# Patient Record
Sex: Female | Born: 1995 | Race: White | Hispanic: No | Marital: Married | Smoking: Never smoker
Health system: Southern US, Community
[De-identification: ages and names within clinical notes are randomized; demographics above are authoritative.]

## PROBLEM LIST (undated history)

## (undated) DIAGNOSIS — E282 Polycystic ovarian syndrome: Secondary | ICD-10-CM

## (undated) HISTORY — DX: Polycystic ovarian syndrome: E28.2

## (undated) HISTORY — PX: CHOLECYSTECTOMY: SHX55

---
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
## 2018-11-23 ENCOUNTER — Other Ambulatory Visit: Payer: Self-pay

## 2018-11-23 DIAGNOSIS — Z20822 Contact with and (suspected) exposure to covid-19: Secondary | ICD-10-CM

## 2018-11-24 LAB — NOVEL CORONAVIRUS, NAA: SARS-CoV-2, NAA: DETECTED — AB

## 2020-01-06 ENCOUNTER — Ambulatory Visit: Payer: Self-pay | Attending: Internal Medicine

## 2020-01-06 DIAGNOSIS — Z23 Encounter for immunization: Secondary | ICD-10-CM

## 2020-01-06 NOTE — Progress Notes (Signed)
   Covid-19 Vaccination Clinic  Name:  Katherine Whitaker    MRN: [id number] DOB: Jun 02, 1995  01/06/2020  Ms. Katherine Whitaker was observed post Covid-19 immunization for 15 minutes without incident. She was provided with Vaccine Information Sheet and instruction to access the V-Safe system.   Ms. Katherine Whitaker was instructed to call 911 with any severe reactions post vaccine: Marland Kitchen Difficulty breathing  . Swelling of face and throat  . A fast heartbeat  . A bad rash all over body  . Dizziness and weakness   Immunizations Administered    No immunizations on file.

## 2021-06-27 ENCOUNTER — Encounter: Payer: Self-pay | Admitting: Radiology

## 2021-07-05 ENCOUNTER — Encounter: Payer: Self-pay | Admitting: Physician Assistant

## 2021-07-05 ENCOUNTER — Ambulatory Visit: Payer: BLUE CROSS/BLUE SHIELD | Admitting: Physician Assistant

## 2021-07-05 VITALS — BP 112/74 | HR 64 | Temp 98.3°F | Ht 64.0 in | Wt 269.0 lb

## 2021-07-05 DIAGNOSIS — E538 Deficiency of other specified B group vitamins: Secondary | ICD-10-CM | POA: Diagnosis not present

## 2021-07-05 DIAGNOSIS — Z13228 Encounter for screening for other metabolic disorders: Secondary | ICD-10-CM

## 2021-07-05 DIAGNOSIS — Z7689 Persons encountering health services in other specified circumstances: Secondary | ICD-10-CM | POA: Diagnosis not present

## 2021-07-05 DIAGNOSIS — Z1321 Encounter for screening for nutritional disorder: Secondary | ICD-10-CM

## 2021-07-05 DIAGNOSIS — Z130 Encounter for screening for diseases of the blood and blood-forming organs and certain disorders involving the immune mechanism: Secondary | ICD-10-CM

## 2021-07-05 DIAGNOSIS — Z1329 Encounter for screening for other suspected endocrine disorder: Secondary | ICD-10-CM

## 2021-07-05 DIAGNOSIS — Z6841 Body Mass Index (BMI) 40.0 and over, adult: Secondary | ICD-10-CM

## 2021-07-05 DIAGNOSIS — R5383 Other fatigue: Secondary | ICD-10-CM | POA: Diagnosis not present

## 2021-07-05 NOTE — Progress Notes (Signed)
New Patient Office Visit  Subjective    Patient ID: Katherine Whitaker, female    DOB: [birth year]  Age: 26 y.o. MRN: [id number]  CC: No chief complaint on file.   HPI Katherine Whitaker presents to establish care. Patient reports remote hx of low vitamin B12 and previously was on B12 injections. States has been struggling to lose weight. Has worked on diet changes and exercise but does seem to be successful. Since 2020 has gained about 70 pounds. States has also noticed acne and coarse facial hair. Feels tired. Periods were regular with OCP which she has been off x about a yr and since then they have been irregular. Patient stays active with playing golf, coaching soccer and gardening. Family history is pertinent for diabetes (maternal grandmother), early onset dementia (maternal grandmother), PCOS (cousin), high cholesterol (paternal grandparents), heart attack (paternal grandfather). Patient has never been a smoker.      Outpatient Encounter Medications as of 07/05/2021  Medication Sig   norethindrone-ethinyl estradiol (JUNEL FE,GILDESS FE,LOESTRIN FE) 1-20 MG-MCG tablet Take 1 tablet by mouth daily. (Patient not taking: Reported on 07/05/2021)   No facility-administered encounter medications on file as of 07/05/2021.    History reviewed. No pertinent past medical history.  Past Surgical History:  Procedure Laterality Date   KNEE SURGERY  2011    Family History  Problem Relation Age of Onset   Melanoma Paternal Grandmother     Social History   Socioeconomic History   Marital status: Married    Spouse name: Not on file   Number of children: Not on file   Years of education: Not on file   Highest education level: Not on file  Occupational History   Not on file  Tobacco Use   Smoking status: Never   Smokeless tobacco: Never  Substance and Sexual Activity   Alcohol use: Yes    Alcohol/week: 0.0 standard drinks    Comment: Less than 1 week   Drug use: No   Sexual activity: Not  Currently    Partners: Male    Birth control/protection: Pill  Other Topics Concern   Not on file  Social History Narrative   Not on file   Social Determinants of Health   Financial Resource Strain: Not on file  Food Insecurity: Not on file  Transportation Needs: Not on file  Physical Activity: Not on file  Stress: Not on file  Social Connections: Not on file  Intimate Partner Violence: Not on file    ROS Review of Systems:  A fourteen system review of systems was performed and found to be positive as per HPI.   Objective    BP 112/74 (BP Location: Left Arm, Patient Position: Sitting, Cuff Size: Large)   Pulse 64   Temp 98.3 F (36.8 C) (Oral)   Ht 5' 4" (1.626 m)   Wt 269 lb (122 kg)   LMP 05/16/2021 (Exact Date)   BMI 46.17 kg/m   Physical Exam General:  Pleasant and cooperative, appropriate for stated age.  Neuro:  Alert and oriented,  extra-ocular muscles intact  HEENT:  Normocephalic, atraumatic, neck supple  Skin:  no gross rash, warm, pink. Cardiac:  RRR, S1 S2 Respiratory: CTA B/L  Vascular:  Ext warm, no cyanosis apprec.; cap RF less 2 sec. Psych:  No HI/SI, judgement and insight good, Euthymic mood. Full Affect.      Assessment & Plan:   Problem List Items Addressed This Visit   None Visit Diagnoses  Class 3 severe obesity with body mass index (BMI) of 40.0 to 44.9 in adult, unspecified obesity type, unspecified whether serious comorbidity present (HCC)    -  Primary   Relevant Orders   Comp Met (CMET)   Lipid Profile   HgB A1c   Fatigue, unspecified type       Relevant Orders   CBC w/Diff   Comp Met (CMET)   HgB A1c   Vitamin D (25 hydroxy)   TSH   B12 deficiency       Relevant Orders   B12 and Folate Panel   Encounter to establish care       Screening for endocrine, nutritional, metabolic and immunity disorder       Relevant Orders   CBC w/Diff   Comp Met (CMET)   Lipid Profile   HgB A1c   Vitamin D (25 hydroxy)   B12 and  Folate Panel       Class 3 severe obesity with body mass index (BMI) of 40.0 to 44.9 in adult, unspecified obesity type, unspecified whether serious comorbidity present: -Encourage to continue weight loss efforts with diet and lifestyle interventions. Will evaluate for cardiovascular risk factors with annual physical labs in 2 weeks. Also discussed with patient possibly underlying PCOS. Will further discuss pending lab results. Patient will also be establishing with OB/GYN, has upcoming visit.  Fatigue: -Will place future lab orders to evaluate for nutritional deficiency, endocrine or metabolic etiology.  B12 deficiency: -Will collect B12 and folate with lab visit. -Recommend to continue oral B12 supplement.   Return in about 2 weeks (around 07/19/2021) for CPE and FBW few days prior.   Lorrene Reid, PA-C

## 2021-07-05 NOTE — Patient Instructions (Signed)
Calorie Counting for Weight Loss Calories are units of energy. Your body needs a certain number of calories from food to keep going throughout the day. When you eat or drink more calories than your body needs, your body stores the extra calories mostly as fat. When you eat or drink fewer calories than your body needs, your body burns fat to get the energy it needs. Calorie counting means keeping track of how many calories you eat and drink each day. Calorie counting can be helpful if you need to lose weight. If you eat fewer calories than your body needs, you should lose weight. Ask your health care provider what a healthy weight is for you. For calorie counting to work, you will need to eat the right number of calories each day to lose a healthy amount of weight per week. A dietitian can help you figure out how many calories you need in a day and will suggest ways to reach your calorie goal. A healthy amount of weight to lose each week is usually 1-2 lb (0.5-0.9 kg). This usually means that your daily calorie intake should be reduced by 500-750 calories. Eating 1,200-1,500 calories a day can help most women lose weight. Eating 1,500-1,800 calories a day can help most men lose weight. What do I need to know about calorie counting? Work with your health care provider or dietitian to determine how many calories you should get each day. To meet your daily calorie goal, you will need to: Find out how many calories are in each food that you would like to eat. Try to do this before you eat. Decide how much of the food you plan to eat. Keep a food log. Do this by writing down what you ate and how many calories it had. To successfully lose weight, it is important to balance calorie counting with a healthy lifestyle that includes regular activity. Where do I find calorie information?  The number of calories in a food can be found on a Nutrition Facts label. If a food does not have a Nutrition Facts label, try  to look up the calories online or ask your dietitian for help. Remember that calories are listed per serving. If you choose to have more than one serving of a food, you will have to multiply the calories per serving by the number of servings you plan to eat. For example, the label on a package of bread might say that a serving size is 1 slice and that there are 90 calories in a serving. If you eat 1 slice, you will have eaten 90 calories. If you eat 2 slices, you will have eaten 180 calories. How do I keep a food log? After each time that you eat, record the following in your food log as soon as possible: What you ate. Be sure to include toppings, sauces, and other extras on the food. How much you ate. This can be measured in cups, ounces, or number of items. How many calories were in each food and drink. The total number of calories in the food you ate. Keep your food log near you, such as in a pocket-sized notebook or on an app or website on your mobile phone. Some programs will calculate calories for you and show you how many calories you have left to meet your daily goal. What are some portion-control tips? Know how many calories are in a serving. This will help you know how many servings you can have of a certain   food. Use a measuring cup to measure serving sizes. You could also try weighing out portions on a kitchen scale. With time, you will be able to estimate serving sizes for some foods. Take time to put servings of different foods on your favorite plates or in your favorite bowls and cups so you know what a serving looks like. Try not to eat straight from a food's packaging, such as from a bag or box. Eating straight from the package makes it hard to see how much you are eating and can lead to overeating. Put the amount you would like to eat in a cup or on a plate to make sure you are eating the right portion. Use smaller plates, glasses, and bowls for smaller portions and to prevent  overeating. Try not to multitask. For example, avoid watching TV or using your computer while eating. If it is time to eat, sit down at a table and enjoy your food. This will help you recognize when you are full. It will also help you be more mindful of what and how much you are eating. What are tips for following this plan? Reading food labels Check the calorie count compared with the serving size. The serving size may be smaller than what you are used to eating. Check the source of the calories. Try to choose foods that are high in protein, fiber, and vitamins, and low in saturated fat, trans fat, and sodium. Shopping Read nutrition labels while you shop. This will help you make healthy decisions about which foods to buy. Pay attention to nutrition labels for low-fat or fat-free foods. These foods sometimes have the same number of calories or more calories than the full-fat versions. They also often have added sugar, starch, or salt to make up for flavor that was removed with the fat. Make a grocery list of lower-calorie foods and stick to it. Cooking Try to cook your favorite foods in a healthier way. For example, try baking instead of frying. Use low-fat dairy products. Meal planning Use more fruits and vegetables. One-half of your plate should be fruits and vegetables. Include lean proteins, such as chicken, turkey, and fish. Lifestyle Each week, aim to do one of the following: 150 minutes of moderate exercise, such as walking. 75 minutes of vigorous exercise, such as running. General information Know how many calories are in the foods you eat most often. This will help you calculate calorie counts faster. Find a way of tracking calories that works for you. Get creative. Try different apps or programs if writing down calories does not work for you. What foods should I eat?  Eat nutritious foods. It is better to have a nutritious, high-calorie food, such as an avocado, than a food with  few nutrients, such as a bag of potato chips. Use your calories on foods and drinks that will fill you up and will not leave you hungry soon after eating. Examples of foods that fill you up are nuts and nut butters, vegetables, lean proteins, and high-fiber foods such as whole grains. High-fiber foods are foods with more than 5 g of fiber per serving. Pay attention to calories in drinks. Low-calorie drinks include water and unsweetened drinks. The items listed above may not be a complete list of foods and beverages you can eat. Contact a dietitian for more information. What foods should I limit? Limit foods or drinks that are not good sources of vitamins, minerals, or protein or that are high in unhealthy fats. These   include: Candy. Other sweets. Sodas, specialty coffee drinks, alcohol, and juice. The items listed above may not be a complete list of foods and beverages you should avoid. Contact a dietitian for more information. How do I count calories when eating out? Pay attention to portions. Often, portions are much larger when eating out. Try these tips to keep portions smaller: Consider sharing a meal instead of getting your own. If you get your own meal, eat only half of it. Before you start eating, ask for a container and put half of your meal into it. When available, consider ordering smaller portions from the menu instead of full portions. Pay attention to your food and drink choices. Knowing the way food is cooked and what is included with the meal can help you eat fewer calories. If calories are listed on the menu, choose the lower-calorie options. Choose dishes that include vegetables, fruits, whole grains, low-fat dairy products, and lean proteins. Choose items that are boiled, broiled, grilled, or steamed. Avoid items that are buttered, battered, fried, or served with cream sauce. Items labeled as crispy are usually fried, unless stated otherwise. Choose water, low-fat milk,  unsweetened iced tea, or other drinks without added sugar. If you want an alcoholic beverage, choose a lower-calorie option, such as a glass of wine or light beer. Ask for dressings, sauces, and syrups on the side. These are usually high in calories, so you should limit the amount you eat. If you want a salad, choose a garden salad and ask for grilled meats. Avoid extra toppings such as bacon, cheese, or fried items. Ask for the dressing on the side, or ask for olive oil and vinegar or lemon to use as dressing. Estimate how many servings of a food you are given. Knowing serving sizes will help you be aware of how much food you are eating at restaurants. Where to find more information Centers for Disease Control and Prevention: www.cdc.gov U.S. Department of Agriculture: myplate.gov Summary Calorie counting means keeping track of how many calories you eat and drink each day. If you eat fewer calories than your body needs, you should lose weight. A healthy amount of weight to lose per week is usually 1-2 lb (0.5-0.9 kg). This usually means reducing your daily calorie intake by 500-750 calories. The number of calories in a food can be found on a Nutrition Facts label. If a food does not have a Nutrition Facts label, try to look up the calories online or ask your dietitian for help. Use smaller plates, glasses, and bowls for smaller portions and to prevent overeating. Use your calories on foods and drinks that will fill you up and not leave you hungry shortly after a meal. This information is not intended to replace advice given to you by your health care provider. Make sure you discuss any questions you have with your health care provider. Document Revised: 03/03/2019 Document Reviewed: 03/03/2019 Elsevier Patient Education  2023 Elsevier Inc.  

## 2021-07-10 ENCOUNTER — Encounter: Payer: Self-pay | Admitting: Radiology

## 2021-07-10 ENCOUNTER — Other Ambulatory Visit (HOSPITAL_COMMUNITY)
Admission: RE | Admit: 2021-07-10 | Discharge: 2021-07-10 | Disposition: A | Discharge status: Home / Self Care | Payer: BLUE CROSS/BLUE SHIELD | Source: Ambulatory Visit | Attending: Radiology | Admitting: Radiology

## 2021-07-10 ENCOUNTER — Ambulatory Visit (INDEPENDENT_AMBULATORY_CARE_PROVIDER_SITE_OTHER): Payer: BLUE CROSS/BLUE SHIELD | Admitting: Radiology

## 2021-07-10 VITALS — BP 118/80 | Ht 63.75 in | Wt 269.0 lb

## 2021-07-10 DIAGNOSIS — Z01419 Encounter for gynecological examination (general) (routine) without abnormal findings: Secondary | ICD-10-CM | POA: Insufficient documentation

## 2021-07-10 DIAGNOSIS — N912 Amenorrhea, unspecified: Secondary | ICD-10-CM | POA: Diagnosis not present

## 2021-07-10 DIAGNOSIS — N970 Female infertility associated with anovulation: Secondary | ICD-10-CM

## 2021-07-10 DIAGNOSIS — E282 Polycystic ovarian syndrome: Secondary | ICD-10-CM

## 2021-07-10 DIAGNOSIS — L700 Acne vulgaris: Secondary | ICD-10-CM

## 2021-07-10 LAB — PREGNANCY, URINE: Preg Test, Ur: NEGATIVE

## 2021-07-10 MED ORDER — ADAPALENE 0.1 % EX GEL: Freq: Every day | CUTANEOUS | 0 refills | Status: DC

## 2021-07-10 NOTE — Progress Notes (Signed)
Katherine Whitaker 13-May-1995 [id number]   History:  26 y.o. G0 with known hx of PCOS presents for annual exam. She was doing well on OCPs, stopped 11 months ago in hopes of becoming pregnant. She has been monitoring ovulation for the past 3 months and has not ovulated. LMP 05/16/21. She is also concerned about her acne, worse on her chin along with hair growth. PCP is checking hgba1c next week and per pt they are considering starting a GLP-1 medication.   Gynecologic History Patient's last menstrual period was 05/16/2021 (exact date). Period Cycle (Days):  (28-56) Period Duration (Days): 5 Period Pattern: (!) Irregular Menstrual Flow: Moderate Dysmenorrhea: (!) Moderate Dysmenorrhea Symptoms: Cramping Contraception/Family planning: none Sexually active: yes Last Pap: 2019. Results were: normal   Obstetric History OB History  Gravida Para Term Preterm AB Living  0 0 0 0 0 0  SAB IAB Ectopic Multiple Live Births  0 0 0 0       The following portions of the patient's history were reviewed and updated as appropriate: allergies, current medications, past family history, past medical history, past social history, past surgical history, and problem list.  Review of Systems Pertinent items noted in HPI and remainder of comprehensive ROS otherwise negative.   Past medical history, past surgical history, family history and social history were all reviewed and documented in the EPIC chart.   Exam:  Vitals:   07/10/21 1345  BP: 118/80  Weight: 269 lb (122 kg)  Height: 5' 3.75" (1.619 m)   Body mass index is 46.54 kg/m.  General appearance:  Normal Thyroid:  Symmetrical, normal in size, without palpable masses or nodularity. Respiratory  Auscultation:  Clear without wheezing or rhonchi Cardiovascular  Auscultation:  Regular rate, without rubs, murmurs or gallops  Edema/varicosities:  Not grossly evident Abdominal  Soft,nontender, without masses, guarding or  rebound.  Liver/spleen:  No organomegaly noted  Hernia:  None appreciated  Skin  Inspection:  Grossly normal Breasts: Examined lying and sitting.   Right: Without masses, retractions, nipple discharge or axillary adenopathy.   Left: Without masses, retractions, nipple discharge or axillary adenopathy. Genitourinary   Inguinal/mons:  Normal without inguinal adenopathy  External genitalia:  Normal appearing vulva with no masses, tenderness, or lesions  BUS/Urethra/Skene's glands:  Normal without masses or exudate  Vagina:  Normal appearing with normal color and discharge, no lesions  Cervix:  Normal appearing without discharge or lesions  Uterus:  Normal in size, shape and contour.  Mobile, nontender  Adnexa/parametria:     Rt: Normal in size, without masses or tenderness.   Lt: Normal in size, without masses or tenderness.  Anus and perineum: Normal   Patient informed chaperone available to be present for breast and pelvic exam. Patient has requested no chaperone to be present. Patient has been advised what will be completed during breast and pelvic exam.   Assessment/Plan:   1. Well woman exam with routine gynecological exam  - Cytology - PAP( [facility])  2. PCOS (polycystic ovarian syndrome)  3. 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 Acne vulgaris Salicylic acid exfoliant 3x/week Micellar water to remove makeup Double cleanse skin We will avoid Spironolactone while she is trying to conceive - adapalene (DIFFERIN) 0.1 % gel; Apply topically at bedtime.  Dispense:  45 g; Refill: 0   Discussed SBE, pap screening as directed/appropriate. Recommend of exercise weekly, including weight bearing exercise. Encouraged the use of seatbelts and  sunscreen. Return in 1 year for annual or as needed.   Ladene Allocca B WHNP-BC 2:26 PM 07/10/2021

## 2021-07-12 LAB — CYTOLOGY - PAP: Diagnosis: NEGATIVE

## 2021-07-15 ENCOUNTER — Other Ambulatory Visit: Payer: BLUE CROSS/BLUE SHIELD

## 2021-07-15 ENCOUNTER — Telehealth: Payer: Self-pay

## 2021-07-15 DIAGNOSIS — Z13228 Encounter for screening for other metabolic disorders: Secondary | ICD-10-CM | POA: Diagnosis not present

## 2021-07-15 DIAGNOSIS — Z1329 Encounter for screening for other suspected endocrine disorder: Secondary | ICD-10-CM | POA: Diagnosis not present

## 2021-07-15 DIAGNOSIS — Z1321 Encounter for screening for nutritional disorder: Secondary | ICD-10-CM | POA: Diagnosis not present

## 2021-07-15 DIAGNOSIS — Z130 Encounter for screening for diseases of the blood and blood-forming organs and certain disorders involving the immune mechanism: Secondary | ICD-10-CM

## 2021-07-15 DIAGNOSIS — E538 Deficiency of other specified B group vitamins: Secondary | ICD-10-CM | POA: Diagnosis not present

## 2021-07-15 DIAGNOSIS — R5383 Other fatigue: Secondary | ICD-10-CM

## 2021-07-15 NOTE — Telephone Encounter (Signed)
[facility] [phone]) called to ask if they can make a change in Rx.  Jami, NP  prescribed Adapalene 0.1% Gel but they are unable to obtain that. They said they are able to fill Rx with Adapalene 0.1% cream. Will that be okay?  She said patient came in on Saturday to pick up Rx and she let her know she would have to check with provider before filling.

## 2021-07-16 LAB — CBC WITH DIFFERENTIAL/PLATELET
Basophils Absolute: 0 10*3/uL (ref 0.0–0.2)
Basos: 0 %
EOS (ABSOLUTE): 0.1 10*3/uL (ref 0.0–0.4)
Eos: 2 %
Hematocrit: 41.8 % (ref 34.0–46.6)
Hemoglobin: 13.6 g/dL (ref 11.1–15.9)
Immature Grans (Abs): 0 10*3/uL (ref 0.0–0.1)
Immature Granulocytes: 0 %
Lymphocytes Absolute: 2.5 10*3/uL (ref 0.7–3.1)
Lymphs: 28 %
MCH: 26.6 pg (ref 26.6–33.0)
MCHC: 32.5 g/dL (ref 31.5–35.7)
MCV: 82 fL (ref 79–97)
Monocytes Absolute: 0.5 10*3/uL (ref 0.1–0.9)
Monocytes: 5 %
Neutrophils Absolute: 5.9 10*3/uL (ref 1.4–7.0)
Neutrophils: 65 %
Platelets: 320 10*3/uL (ref 150–450)
RBC: 5.11 x10E6/uL (ref 3.77–5.28)
RDW: 13.7 % (ref 11.7–15.4)
WBC: 9.1 10*3/uL (ref 3.4–10.8)

## 2021-07-16 LAB — COMPREHENSIVE METABOLIC PANEL
ALT: 33 IU/L — ABNORMAL HIGH (ref 0–32)
AST: 20 IU/L (ref 0–40)
Albumin/Globulin Ratio: 1.9 (ref 1.2–2.2)
Albumin: 4.3 g/dL (ref 3.9–5.0)
Alkaline Phosphatase: 99 IU/L (ref 44–121)
BUN/Creatinine Ratio: 10 (ref 9–23)
BUN: 9 mg/dL (ref 6–20)
Bilirubin Total: 0.2 mg/dL (ref 0.0–1.2)
CO2: 22 mmol/L (ref 20–29)
Calcium: 9.5 mg/dL (ref 8.7–10.2)
Chloride: 103 mmol/L (ref 96–106)
Creatinine, Ser: 0.86 mg/dL (ref 0.57–1.00)
Globulin, Total: 2.3 g/dL (ref 1.5–4.5)
Glucose: 85 mg/dL (ref 70–99)
Potassium: 4.7 mmol/L (ref 3.5–5.2)
Sodium: 141 mmol/L (ref 134–144)
Total Protein: 6.6 g/dL (ref 6.0–8.5)
eGFR: 95 mL/min/{1.73_m2} (ref >59)

## 2021-07-16 LAB — LIPID PANEL
Chol/HDL Ratio: 4.3 ratio (ref 0.0–4.4)
Cholesterol, Total: 175 mg/dL (ref 100–199)
HDL: 41 mg/dL (ref >39)
LDL Chol Calc (NIH): 117 mg/dL — ABNORMAL HIGH (ref 0–99)
Triglycerides: 90 mg/dL (ref 0–149)
VLDL Cholesterol Cal: 17 mg/dL (ref 5–40)

## 2021-07-16 LAB — HEMOGLOBIN A1C
Est. average glucose Bld gHb Est-mCnc: 108 mg/dL
Hgb A1c MFr Bld: 5.4 % (ref 4.8–5.6)

## 2021-07-16 LAB — B12 AND FOLATE PANEL
Folate: 2.8 ng/mL — ABNORMAL LOW (ref >3.0)
Vitamin B-12: 1869 pg/mL — ABNORMAL HIGH (ref 232–1245)

## 2021-07-16 LAB — VITAMIN D 25 HYDROXY (VIT D DEFICIENCY, FRACTURES): Vit D, 25-Hydroxy: 41.6 ng/mL (ref 30.0–100.0)

## 2021-07-16 LAB — TSH: TSH: 1.48 u[IU]/mL (ref 0.450–4.500)

## 2021-07-16 NOTE — Telephone Encounter (Signed)
That's fine to substitute.

## 2021-07-16 NOTE — Telephone Encounter (Signed)
I called the pharmacy prior to their opening and left detailed message in voice mail that okay to fill Rx with the cream.

## 2021-07-19 NOTE — Patient Instructions (Signed)

## 2021-07-22 ENCOUNTER — Ambulatory Visit (INDEPENDENT_AMBULATORY_CARE_PROVIDER_SITE_OTHER): Payer: BLUE CROSS/BLUE SHIELD | Admitting: Physician Assistant

## 2021-07-22 ENCOUNTER — Ambulatory Visit
Admission: RE | Admit: 2021-07-22 | Discharge: 2021-07-22 | Disposition: A | Discharge status: Home / Self Care | Payer: BLUE CROSS/BLUE SHIELD | Source: Ambulatory Visit | Attending: Physician Assistant | Admitting: Physician Assistant

## 2021-07-22 ENCOUNTER — Telehealth: Payer: Self-pay

## 2021-07-22 ENCOUNTER — Encounter: Payer: Self-pay | Admitting: Physician Assistant

## 2021-07-22 VITALS — BP 105/74 | HR 81 | Ht 63.0 in | Wt 270.2 lb

## 2021-07-22 DIAGNOSIS — M79671 Pain in right foot: Secondary | ICD-10-CM

## 2021-07-22 DIAGNOSIS — Z124 Encounter for screening for malignant neoplasm of cervix: Secondary | ICD-10-CM | POA: Diagnosis not present

## 2021-07-22 DIAGNOSIS — R748 Abnormal levels of other serum enzymes: Secondary | ICD-10-CM

## 2021-07-22 DIAGNOSIS — M7989 Other specified soft tissue disorders: Secondary | ICD-10-CM | POA: Diagnosis not present

## 2021-07-22 DIAGNOSIS — Z0000 Encounter for general adult medical examination without abnormal findings: Secondary | ICD-10-CM | POA: Diagnosis not present

## 2021-07-22 DIAGNOSIS — E7800 Pure hypercholesterolemia, unspecified: Secondary | ICD-10-CM | POA: Diagnosis not present

## 2021-07-22 DIAGNOSIS — S99921A Unspecified injury of right foot, initial encounter: Secondary | ICD-10-CM | POA: Diagnosis not present

## 2021-07-22 DIAGNOSIS — E538 Deficiency of other specified B group vitamins: Secondary | ICD-10-CM

## 2021-07-22 NOTE — Progress Notes (Signed)
Complete physical exam   Patient: Katherine Whitaker   DOB: [birth year]   25 y.o. Female  MRN: [id number] Visit Date: 07/22/2021   Chief Complaint  Patient presents with   Annual Exam    Patient presents today for physical. She would like to discuss labs. She has been to OB/GYN since her last visit.    Subjective    Katherine Whitaker is a 26 y.o. female who presents today for a complete physical exam.  She reports consuming a low fat diet.  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  She generally feels fairly well. She does have additional problems to discuss today. Patient reports Saturday was playing softball and was running to first base, came to an awkward stop and felt pain over right midfoot, states gets sharp pain when she puts full pressure in the right foot. No swelling or bruising. Denies popping sensation or twisting her ankle.     History reviewed. No pertinent past medical history. Past Surgical History:  Procedure Laterality Date   KNEE SURGERY  2011   Social History   Socioeconomic History   Marital status: Married    Spouse name: Not on file   Number of children: Not on file   Years of education: Not on file   Highest education level: Not on file  Occupational History   Not on file  Tobacco Use   Smoking status: Never   Smokeless tobacco: Never  Substance and Sexual Activity   Alcohol use: Yes    Alcohol/week: 0.0 standard drinks of alcohol    Comment: Less than 1 week   Drug use: No   Sexual activity: Yes    Partners: Male    Birth control/protection: None  Other Topics Concern   Not on file  Social History Narrative   Not on file   Social Determinants of Health   Financial Resource Strain: Not on file  Food Insecurity: Not on file  Transportation Needs: Not on file  Physical Activity: Not on file  Stress: Not on file  Social Connections: Not on file  Intimate Partner Violence: Not on file      Medications: Outpatient Medications Prior to Visit  Medication Sig   adapalene (DIFFERIN) 0.1 % gel Apply topically at bedtime.   Prenatal Vit-Fe Fumarate-FA (PRENATAL PO) Take by mouth.   VITAMIN D PO Take by mouth.   Cyanocobalamin (B-12 PO) Take by mouth. (Patient not taking: Reported on 07/22/2021)   No facility-administered medications prior to visit.    Review of Systems Review of Systems:  A fourteen system review of systems was performed and found to be positive as per HPI.  Last CBC Lab Results  Component Value Date   WBC 9.1 07/15/2021   HGB 13.6 07/15/2021   HCT 41.8 07/15/2021   MCV 82 07/15/2021   MCH 26.6 07/15/2021   RDW 13.7 07/15/2021   PLT 320 37/16/9678   Last metabolic panel Lab Results  Component Value Date   GLUCOSE 85 07/15/2021   NA 141 07/15/2021   K 4.7 07/15/2021   CL 103 07/15/2021   CO2 22 07/15/2021   BUN 9 07/15/2021   CREATININE 0.86 07/15/2021   EGFR 95 07/15/2021   CALCIUM 9.5 07/15/2021   PROT 6.6 07/15/2021   ALBUMIN 4.3 07/15/2021   LABGLOB 2.3 07/15/2021   AGRATIO 1.9 07/15/2021   BILITOT 0.2 07/15/2021   ALKPHOS 99 07/15/2021   AST 20 07/15/2021   ALT 33 (H)  07/15/2021   Last lipids Lab Results  Component Value Date   CHOL 175 07/15/2021   HDL 41 07/15/2021   LDLCALC 117 (H) 07/15/2021   TRIG 90 07/15/2021   CHOLHDL 4.3 07/15/2021   Last hemoglobin A1c Lab Results  Component Value Date   HGBA1C 5.4 07/15/2021   Last thyroid functions Lab Results  Component Value Date   TSH 1.480 07/15/2021   Last vitamin D Lab Results  Component Value Date   VD25OH 41.6 07/15/2021   Last vitamin B12 and Folate Lab Results  Component Value Date   VITAMINB12 1,869 (H) 07/15/2021   FOLATE 2.8 (L) 07/15/2021    Objective     BP 105/74   Pulse 81   Ht 5' 3"  (1.6 m)   Wt 270 lb 3.2 oz (122.6 kg)   LMP 05/16/2021 (Exact Date)   SpO2 96%   BMI 47.86 kg/m  BP Readings from Last 3 Encounters:  07/22/21 105/74   07/10/21 118/80  07/05/21 112/74   Wt Readings from Last 3 Encounters:  07/22/21 270 lb 3.2 oz (122.6 kg)  07/10/21 269 lb (122 kg)  07/05/21 269 lb (122 kg)    Physical Exam   General Appearance:    Alert, cooperative, in no acute distress, appears stated age   Head:    Normocephalic, without obvious abnormality, atraumatic  Eyes:    PERRL, conjunctiva/corneas clear, EOM's intact, fundi    benign, both eyes  Ears:    Normal TM's and external ear canals, both ears  Nose:   Nares normal, septum midline, mucosa normal, no drainage    or sinus tenderness  Throat:   Lips, mucosa, and tongue normal; teeth and gums normal  Neck:   Supple, symmetrical, trachea midline, no adenopathy;    thyroid:  no enlargement/tenderness/nodules; no JVD  Back:     Symmetric, no curvature, ROM normal, no CVA tenderness  Lungs:     Clear to auscultation bilaterally, respirations unlabored  Chest Wall:    No tenderness or deformity   Heart:    Normal heart rate. Normal rhythm. No murmurs, rubs, or gallops.   Breast Exam:    deferred  Abdomen:     Soft, non-tender, bowel sounds active all four quadrants,    no masses, no organomegaly  Pelvic:    deferred  Extremities:   All extremities are intact. No cyanosis or edema. Tenderness of right foot especially at the base of the heel.  Pulses:   2+ and symmetric all extremities  Skin:   Skin color, texture, turgor normal, no rashes or lesions  Lymph nodes:   Cervical and supraclavicular nodes normal  Neurologic:   CNII-XII grossly intact.     Last depression screening scores    07/22/2021   11:00 AM 07/05/2021   10:30 AM  PHQ 2/9 Scores  PHQ - 2 Score 0 1  PHQ- 9 Score 0 6  Exception Documentation Medical reason    Last fall risk screening    07/22/2021   11:00 AM  Fall Risk   Falls in the past year? 1  Number falls in past yr: 1  Injury with Fall? 1  Risk for fall due to : Orthopedic patient  Follow up Education provided;Falls evaluation  completed     No results found for any visits on 07/22/21.  Assessment & Plan    Routine Health Maintenance and Physical Exam  Exercise Activities and Dietary recommendations -Discussed heart healthy diet low in fat and carbohydrates.  Immunization History  Administered Date(s) Administered   Moderna SARS-COV2 Booster Vaccination 01/06/2020   PFIZER(Purple Top)SARS-COV-2 Vaccination 04/26/2019, 05/24/2019    Health Maintenance  Topic Date Due   HPV VACCINES (1 - 2-dose series) Never done   HIV Screening  Never done   Hepatitis C Screening  Never done   TETANUS/TDAP  Never done   COVID-19 Vaccine (3 - Pfizer series) 03/02/2020   INFLUENZA VACCINE  09/03/2021   PAP-Cervical Cytology Screening  07/10/2024   PAP SMEAR-Modifier  07/10/2024    Discussed health benefits of physical activity, and encouraged her to engage in regular exercise appropriate for her age and condition.  Problem List Items Addressed This Visit   None Visit Diagnoses     Healthcare maintenance    -  Primary   Screening for cervical cancer       Right foot pain       Relevant Orders   DG Foot Complete Right   Elevated LDL cholesterol level       Elevated liver enzymes       Low folate          Discussed with patient most recent lab results which are essentially within normal limits with the exception of lipid panel, mild elevation of ALT and mildly low folate. Discussed with patient to start taking folic acid 1 mg and reduce B12 supplement dose. Encourage to continue weight loss efforts with diet and lifestyle interventions. Continue low fat diet. Recommend repeating fasting labs (lipid panel, cmp, b12/folate) in 6 months. Patient reports has obtined HPV and Tdap vaccines. Will verify with [facility]. UTD pap (followed by OB/GYN). Will place x-ray order for right foot to evaluate for fracture. Recommend to continue with non-weight bearing until x-ray results.    Return in about 6 months (around  01/21/2022) for HLD, Wt and FBW (lipid panel, cmp, b12/folate).       Lorrene Reid, PA-C  [facility] [phone] (phone) [phone] (fax)  [place]

## 2021-07-22 NOTE — Telephone Encounter (Signed)
Spoke with patient explained to her that her Gardasil  was not showing up in the [place] data base, she explained to me that Dr Maurice Small did this for her and she would sign a release and get [place] a copy for our records.

## 2021-07-25 DIAGNOSIS — M79671 Pain in right foot: Secondary | ICD-10-CM | POA: Insufficient documentation

## 2021-07-26 ENCOUNTER — Other Ambulatory Visit: Payer: BLUE CROSS/BLUE SHIELD

## 2021-07-26 DIAGNOSIS — M722 Plantar fascial fibromatosis: Secondary | ICD-10-CM | POA: Insufficient documentation

## 2021-07-26 DIAGNOSIS — M6289 Other specified disorders of muscle: Secondary | ICD-10-CM | POA: Insufficient documentation

## 2021-07-26 DIAGNOSIS — L700 Acne vulgaris: Secondary | ICD-10-CM | POA: Diagnosis not present

## 2021-07-26 DIAGNOSIS — N970 Female infertility associated with anovulation: Secondary | ICD-10-CM

## 2021-07-26 DIAGNOSIS — M76821 Posterior tibial tendinitis, right leg: Secondary | ICD-10-CM | POA: Diagnosis not present

## 2021-07-26 DIAGNOSIS — M779 Enthesopathy, unspecified: Secondary | ICD-10-CM | POA: Insufficient documentation

## 2021-07-26 DIAGNOSIS — R29898 Other symptoms and signs involving the musculoskeletal system: Secondary | ICD-10-CM | POA: Diagnosis not present

## 2021-07-26 DIAGNOSIS — N912 Amenorrhea, unspecified: Secondary | ICD-10-CM | POA: Diagnosis not present

## 2021-07-26 DIAGNOSIS — E782 Mixed hyperlipidemia: Secondary | ICD-10-CM | POA: Diagnosis not present

## 2021-07-27 LAB — FOLLICLE STIMULATING HORMONE: FSH: 6.8 m[IU]/mL

## 2021-07-27 LAB — PROLACTIN: Prolactin: 7.6 ng/mL

## 2021-07-29 LAB — ANTI-MULLERIAN HORMONE (AMH), FEMALE: Anti-Mullerian Hormones(AMH), Female: 2.84 ng/mL (ref 0.69–13.39)

## 2021-07-30 ENCOUNTER — Other Ambulatory Visit: Payer: Self-pay

## 2021-07-30 DIAGNOSIS — N979 Female infertility, unspecified: Secondary | ICD-10-CM

## 2021-07-30 LAB — TESTOSTERONE, FREE & TOTAL

## 2021-07-30 LAB — ESTRADIOL: Estradiol: 38 pg/mL

## 2021-07-30 LAB — DHEA-SULFATE: DHEA-SO4: 400 ug/dL — ABNORMAL HIGH (ref 14–349)

## 2021-08-13 ENCOUNTER — Other Ambulatory Visit: Payer: BLUE CROSS/BLUE SHIELD

## 2021-08-13 DIAGNOSIS — N979 Female infertility, unspecified: Secondary | ICD-10-CM | POA: Diagnosis not present

## 2021-08-14 ENCOUNTER — Telehealth: Payer: Self-pay

## 2021-08-14 LAB — PROGESTERONE: Progesterone: 0.8 ng/mL

## 2021-08-14 NOTE — Telephone Encounter (Signed)
Patient scheduled on 10/14/21

## 2021-08-14 NOTE — Telephone Encounter (Signed)
Referral faxed to [facility]. 

## 2021-08-14 NOTE — Progress Notes (Signed)
Progesterone low, likely anovulatory cycles. Refer to [facility].

## 2021-08-14 NOTE — Telephone Encounter (Signed)
Tanda Rockers, NP  [facility] Triage Progesterone low, likely anovulatory cycles. Refer to [facility].

## 2021-08-23 DIAGNOSIS — M76821 Posterior tibial tendinitis, right leg: Secondary | ICD-10-CM | POA: Diagnosis not present

## 2021-08-23 DIAGNOSIS — R29898 Other symptoms and signs involving the musculoskeletal system: Secondary | ICD-10-CM | POA: Diagnosis not present

## 2021-08-23 DIAGNOSIS — M722 Plantar fascial fibromatosis: Secondary | ICD-10-CM | POA: Diagnosis not present

## 2021-10-14 DIAGNOSIS — E559 Vitamin D deficiency, unspecified: Secondary | ICD-10-CM | POA: Diagnosis not present

## 2021-10-14 DIAGNOSIS — E282 Polycystic ovarian syndrome: Secondary | ICD-10-CM | POA: Diagnosis not present

## 2021-10-14 DIAGNOSIS — Z3169 Encounter for other general counseling and advice on procreation: Secondary | ICD-10-CM | POA: Diagnosis not present

## 2021-10-14 DIAGNOSIS — E288 Other ovarian dysfunction: Secondary | ICD-10-CM | POA: Diagnosis not present

## 2021-10-14 DIAGNOSIS — Z319 Encounter for procreative management, unspecified: Secondary | ICD-10-CM | POA: Diagnosis not present

## 2021-11-05 DIAGNOSIS — E282 Polycystic ovarian syndrome: Secondary | ICD-10-CM | POA: Diagnosis not present

## 2021-11-05 DIAGNOSIS — Z3169 Encounter for other general counseling and advice on procreation: Secondary | ICD-10-CM | POA: Diagnosis not present

## 2021-12-09 DIAGNOSIS — Z3200 Encounter for pregnancy test, result unknown: Secondary | ICD-10-CM | POA: Diagnosis not present

## 2021-12-09 DIAGNOSIS — Z3141 Encounter for fertility testing: Secondary | ICD-10-CM | POA: Diagnosis not present

## 2022-01-01 ENCOUNTER — Other Ambulatory Visit: Payer: Self-pay

## 2022-01-01 DIAGNOSIS — E538 Deficiency of other specified B group vitamins: Secondary | ICD-10-CM

## 2022-01-01 DIAGNOSIS — R748 Abnormal levels of other serum enzymes: Secondary | ICD-10-CM

## 2022-01-01 DIAGNOSIS — E7800 Pure hypercholesterolemia, unspecified: Secondary | ICD-10-CM

## 2022-01-01 DIAGNOSIS — R5383 Other fatigue: Secondary | ICD-10-CM

## 2022-01-01 DIAGNOSIS — Z0000 Encounter for general adult medical examination without abnormal findings: Secondary | ICD-10-CM

## 2022-01-09 ENCOUNTER — Ambulatory Visit (INDEPENDENT_AMBULATORY_CARE_PROVIDER_SITE_OTHER): Payer: BLUE CROSS/BLUE SHIELD | Admitting: Radiology

## 2022-01-09 VITALS — BP 132/82

## 2022-01-09 DIAGNOSIS — Z23 Encounter for immunization: Secondary | ICD-10-CM

## 2022-01-09 DIAGNOSIS — N912 Amenorrhea, unspecified: Secondary | ICD-10-CM

## 2022-01-09 DIAGNOSIS — Z3201 Encounter for pregnancy test, result positive: Secondary | ICD-10-CM

## 2022-01-09 LAB — PREGNANCY, URINE: Preg Test, Ur: POSITIVE — AB

## 2022-01-09 NOTE — Progress Notes (Signed)
   Katherine Whitaker 07-01-1995 [id number]   History:  26 y.o. G0 presents for +UPT at home 3 days ago. Has been trying for pregnancy for the past 17 months.  Gynecologic History Patient's last menstrual period was 12/01/2021 (exact date).   Obstetric History OB History  Gravida Para Term Preterm AB Living  0 0 0 0 0 0  SAB IAB Ectopic Multiple Live Births  0 0 0 0       The following portions of the patient's history were reviewed and updated as appropriate: allergies, current medications, past family history, past medical history, past social history, past surgical history, and problem list.  Review of Systems Pertinent items noted in HPI and remainder of comprehensive ROS otherwise negative.   Past medical history, past surgical history, family history and social history were all reviewed and documented in the EPIC chart.   Exam:  Vitals:   01/09/22 1426  BP: 132/82   There is no height or weight on file to calculate BMI.  General appearance:  Normal, obese Genitourinary   Inguinal/mons:  Normal without inguinal adenopathy  External genitalia:  Normal appearing vulva with no masses, tenderness, or lesions  BUS/Urethra/Skene's glands:  Normal without masses or exudate  Vagina:  Normal appearing with normal color and discharge, no lesions  Cervix:  Normal appearing without discharge or lesions  Uterus:  Normal in size, shape and contour.  Mobile, nontender  Adnexa/parametria:     Rt: Normal in size, without masses or tenderness.   Lt: Normal in size, without masses or tenderness.  Anus and perineum: Normal   Patient informed chaperone available to be present for breast and pelvic exam. Patient has requested no chaperone to be present. Patient has been advised what will be completed during breast and pelvic exam.   Assessment/Plan:   1. Amenorrhea - Pregnancy, urine +  2. Need for immunization against influenza - Flu Vaccine QUAD 80mo+IM (Fluarix, Fluzone &  Alfiuria Quad PF)  3. Positive pregnancy test EDD based on LMP 09/07/22 - B-HCG Quant  Schedule NOB visit with OB provider  Continue PNV  Idalis Hoelting B WHNP-BC 2:59 PM 01/09/2022

## 2022-01-10 ENCOUNTER — Other Ambulatory Visit: Payer: BLUE CROSS/BLUE SHIELD

## 2022-01-10 ENCOUNTER — Other Ambulatory Visit: Payer: Self-pay | Admitting: *Deleted

## 2022-01-10 DIAGNOSIS — Z3201 Encounter for pregnancy test, result positive: Secondary | ICD-10-CM

## 2022-01-10 LAB — HCG, QUANTITATIVE, PREGNANCY: HCG, Total, QN: 1216 m[IU]/mL

## 2022-01-13 ENCOUNTER — Other Ambulatory Visit: Payer: BLUE CROSS/BLUE SHIELD

## 2022-01-13 DIAGNOSIS — Z3201 Encounter for pregnancy test, result positive: Secondary | ICD-10-CM | POA: Diagnosis not present

## 2022-01-14 LAB — HCG, QUANTITATIVE, PREGNANCY: HCG, Total, QN: 5351 m[IU]/mL

## 2022-01-20 ENCOUNTER — Ambulatory Visit: Payer: BLUE CROSS/BLUE SHIELD

## 2022-01-21 ENCOUNTER — Ambulatory Visit: Payer: BLUE CROSS/BLUE SHIELD | Admitting: Physician Assistant

## 2022-01-23 ENCOUNTER — Ambulatory Visit (INDEPENDENT_AMBULATORY_CARE_PROVIDER_SITE_OTHER): Payer: BLUE CROSS/BLUE SHIELD

## 2022-01-23 VITALS — Wt 270.0 lb

## 2022-01-23 DIAGNOSIS — Z3480 Encounter for supervision of other normal pregnancy, unspecified trimester: Secondary | ICD-10-CM | POA: Insufficient documentation

## 2022-01-23 DIAGNOSIS — Z369 Encounter for antenatal screening, unspecified: Secondary | ICD-10-CM

## 2022-01-23 HISTORY — DX: Encounter for supervision of other normal pregnancy, unspecified trimester: Z34.80

## 2022-01-23 NOTE — Progress Notes (Signed)
New OB Intake  I connected with  Katherine Whitaker on 01/23/22 at  1:15 PM EST by telephone and verified that I am speaking with the correct person using two identifiers. Nurse is located at [employer] and pt is located at home.  I explained I am completing New OB Intake today. We discussed her EDD of 09/07/2022 that is based on LMP of 12/01/2021. Pt is G1/P0. I reviewed her allergies, medications, Medical/Surgical/OB history, and appropriate screenings. Based on history, this is a/an pregnancy uncomplicated .   Patient Active Problem List   Diagnosis Date Noted   Supervision of other normal pregnancy, antepartum 01/23/2022    Concerns addressed today Asked if she should/could have her Beta HCG drawn again; adv there is really no need to as her numbers were beautiful.  Delivery Plans:  Plans to deliver at [facility] - pt not sure; adv if she goes elsewhere she will have someone deliver her that she hasn't met before.  Anatomy US Explained first scheduled [place] will be scheduled soon and an anatomy scan will be done at 20 weeks.  Labs Discussed genetic screening with patient. Patient desires genetic testing to be drawn with new OB labs.  Discussed possible labs to be drawn at new OB appointment.  COVID Vaccine Patient has had COVID vaccine.   Social Determinants of Health Food Insecurity: denies food insecurity Transportation: Patient denies transportation needs.  First visit review I reviewed new OB appt with pt. I explained she will have ob bloodwork and pap smear/pelvic exam if indicated. Explained pt will be seen by Dr. Rubie Maid at first visit; encounter routed to appropriate provider.   Katherine Whitaker, [place] 01/23/2022  1:39 PM

## 2022-01-23 NOTE — Patient Instructions (Signed)
First Trimester of Pregnancy  The first trimester of pregnancy starts on the first day of your last menstrual period until the end of week 12. This is also called months 1 through 3 of pregnancy. Body changes during your first trimester Your body goes through many changes during pregnancy. The changes usually return to normal after your baby is born. Physical changes You may gain or lose weight. Your breasts may grow larger and hurt. The area around your nipples may get darker. Dark spots or blotches may develop on your face. You may have changes in your hair. Health changes You may feel like you might vomit (nauseous), and you may vomit. You may have heartburn. You may have headaches. You may have trouble pooping (constipation). Your gums may bleed. Other changes You may get tired easily. You may pee (urinate) more often. Your menstrual periods will stop. You may not feel hungry. You may want to eat certain kinds of food. You may have changes in your emotions from day to day. You may have more dreams. Follow these instructions at home: Medicines Take over-the-counter and prescription medicines only as told by your doctor. Some medicines are not safe during pregnancy. Take a prenatal vitamin that contains at least 600 micrograms (mcg) of folic acid. Eating and drinking Eat healthy meals that include: Fresh fruits and vegetables. Whole grains. Good sources of protein, such as meat, eggs, or tofu. Low-fat dairy products. Avoid raw meat and unpasteurized juice, milk, and cheese. If you feel like you may vomit, or you vomit: Eat 4 or 5 small meals a day instead of 3 large meals. Try eating a few soda crackers. Drink liquids between meals instead of during meals. You may need to take these actions to prevent or treat trouble pooping: Drink enough fluids to keep your pee (urine) pale yellow. Eat foods that are high in fiber. These include beans, whole grains, and fresh fruits and  vegetables. Limit foods that are high in fat and sugar. These include fried or sweet foods. Activity Exercise only as told by your doctor. Most people can do their usual exercise routine during pregnancy. Stop exercising if you have cramps or pain in your lower belly (abdomen) or low back. Do not exercise if it is too hot or too humid, or if you are in a place of great height (high altitude). Avoid heavy lifting. If you choose to, you may have sex unless your doctor tells you not to. Relieving pain and discomfort Wear a good support bra if your breasts are sore. Rest with your legs raised (elevated) if you have leg cramps or low back pain. If you have bulging veins (varicose veins) in your legs: Wear support hose as told by your doctor. Raise your feet for 15 minutes, 3-4 times a day. Limit salt in your food. Safety Wear your seat belt at all times when you are in a car. Talk with your doctor if someone is hurting you or yelling at you. Talk with your doctor if you are feeling sad or have thoughts of hurting yourself. Lifestyle Do not use hot tubs, steam rooms, or saunas. Do not douche. Do not use tampons or scented sanitary pads. Do not use herbal medicines, illegal drugs, or medicines that are not approved by your doctor. Do not drink alcohol. Do not smoke or use any products that contain nicotine or tobacco. If you need help quitting, ask your doctor. Avoid cat litter boxes and soil that is used by cats. These carry  germs that can cause harm to the baby and can cause a loss of your baby by miscarriage or stillbirth. General instructions Keep all follow-up visits. This is important. Ask for help if you need counseling or if you need help with nutrition. Your doctor can give you advice or tell you where to go for help. Visit your dentist. At home, brush your teeth with a soft toothbrush. Floss gently. Write down your questions. Take them to your prenatal visits. Where to find more  information American Pregnancy Association: americanpregnancy.org American College of Obstetricians and Gynecologists: www.acog.org Office on Women's Health: womenshealth.gov/pregnancy Contact a doctor if: You are dizzy. You have a fever. You have mild cramps or pressure in your lower belly. You have a nagging pain in your belly area. You continue to feel like you may vomit, you vomit, or you have watery poop (diarrhea) for 24 hours or longer. You have a bad-smelling fluid coming from your vagina. You have pain when you pee. You are exposed to a disease that spreads from person to person, such as chickenpox, measles, Zika virus, HIV, or hepatitis. Get help right away if: You have spotting or bleeding from your vagina. You have very bad belly cramping or pain. You have shortness of breath or chest pain. You have any kind of injury, such as from a fall or a car crash. You have new or increased pain, swelling, or redness in an arm or leg. Summary The first trimester of pregnancy starts on the first day of your last menstrual period until the end of week 12 (months 1 through 3). Eat 4 or 5 small meals a day instead of 3 large meals. Do not smoke or use any products that contain nicotine or tobacco. If you need help quitting, ask your doctor. Keep all follow-up visits. This information is not intended to replace advice given to you by your health care provider. Make sure you discuss any questions you have with your health care provider. Document Revised: 06/29/2019 Document Reviewed: 05/05/2019 Elsevier Patient Education  2023 Elsevier Inc. Commonly Asked Questions During Pregnancy  Cats: A parasite can be excreted in cat feces.  To avoid exposure you need to have another person empty the little box.  If you must empty the litter box you will need to wear gloves.  Wash your hands after handling your cat.  This parasite can also be found in raw or undercooked meat so this should also be  avoided.  Colds, Sore Throats, Flu: Please check your medication sheet to see what you can take for symptoms.  If your symptoms are unrelieved by these medications please call the office.  Dental Work: Most any dental work your dentist recommends is permitted.  X-rays should only be taken during the first trimester if absolutely necessary.  Your abdomen should be shielded with a lead apron during all x-rays.  Please notify your provider prior to receiving any x-rays.  Novocaine is fine; gas is not recommended.  If your dentist requires a note from us prior to dental work please call the office and we will provide one for you.  Exercise: Exercise is an important part of staying healthy during your pregnancy.  You may continue most exercises you were accustomed to prior to pregnancy.  Later in your pregnancy you will most likely notice you have difficulty with activities requiring balance like riding a bicycle.  It is important that you listen to your body and avoid activities that put you at a higher   risk of falling.  Adequate rest and staying well hydrated are a must!  If you have questions about the safety of specific activities ask your provider.    Exposure to Children with illness: Try to avoid obvious exposure; report any symptoms to [place] when noted,  If you have chicken pos, red measles or mumps, you should be immune to these diseases.   Please do not take any vaccines while pregnant unless you have checked with your OB provider.  Fetal Movement: After 28 weeks we recommend you do "kick counts" twice daily.  Lie or sit down in a calm quiet environment and count your baby movements "kicks".  You should feel your baby at least 10 times per hour.  If you have not felt 10 kicks within the first hour get up, walk around and have something sweet to eat or drink then repeat for an additional hour.  If count remains less than 10 per hour notify your provider.  Fumigating: Follow your pest control agent's  advice as to how long to stay out of your home.  Ventilate the area well before re-entering.  Hemorrhoids:   Most over-the-counter preparations can be used during pregnancy.  Check your medication to see what is safe to use.  It is important to use a stool softener or fiber in your diet and to drink lots of liquids.  If hemorrhoids seem to be getting worse please call the office.   Hot Tubs:  Hot tubs Jacuzzis and saunas are not recommended while pregnant.  These increase your internal body temperature and should be avoided.  Intercourse:  Sexual intercourse is safe during pregnancy as long as you are comfortable, unless otherwise advised by your provider.  Spotting may occur after intercourse; report any bright red bleeding that is heavier than spotting.  Labor:  If you know that you are in labor, please go to the hospital.  If you are unsure, please call the office and let us help you decide what to do.  Lifting, straining, etc:  If your job requires heavy lifting or straining please check with your provider for any limitations.  Generally, you should not lift items heavier than that you can lift simply with your hands and arms (no back muscles)  Painting:  Paint fumes do not harm your pregnancy, but may make you ill and should be avoided if possible.  Latex or water based paints have less odor than oils.  Use adequate ventilation while painting.  Permanents & Hair Color:  Chemicals in hair dyes are not recommended as they cause increase hair dryness which can increase hair loss during pregnancy.  " Highlighting" and permanents are allowed.  Dye may be absorbed differently and permanents may not hold as well during pregnancy.  Sunbathing:  Use a sunscreen, as skin burns easily during pregnancy.  Drink plenty of fluids; avoid over heating.  Tanning Beds:  Because their possible side effects are still unknown, tanning beds are not recommended.  Ultrasound Scans:  Routine ultrasounds are performed  at approximately 20 weeks.  You will be able to see your baby's general anatomy an if you would like to know the gender this can usually be determined as well.  If it is questionable when you conceived you may also receive an ultrasound early in your pregnancy for dating purposes.  Otherwise ultrasound exams are not routinely performed unless there is a medical necessity.  Although you can request a scan we ask that you pay for it when  conducted because insurance does not cover " patient request" scans.  Work: If your pregnancy proceeds without complications you may work until your due date, unless your physician or employer advises otherwise.  Round Ligament Pain/Pelvic Discomfort:  Sharp, shooting pains not associated with bleeding are fairly common, usually occurring in the second trimester of pregnancy.  They tend to be worse when standing up or when you remain standing for long periods of time.  These are the result of pressure of certain pelvic ligaments called "round ligaments".  Rest, Tylenol and heat seem to be the most effective relief.  As the womb and fetus grow, they rise out of the pelvis and the discomfort improves.  Please notify the office if your pain seems different than that described.  It may represent a more serious condition.  Common Medications Safe in Pregnancy  Acne:      Constipation:  Benzoyl Peroxide     Colace  Clindamycin      Dulcolax Suppository  Topica Erythromycin     Fibercon  Salicylic Acid      Metamucil         Miralax AVOID:        Senakot   Accutane    Cough:  Retin-A       Cough Drops  Tetracycline      Phenergan w/ Codeine if Rx  Minocycline      Robitussin (Plain & DM)  Antibiotics:     Crabs/Lice:  Ceclor       RID  Cephalosporins    AVOID:  E-Mycins      Kwell  Keflex  Macrobid/Macrodantin   Diarrhea:  Penicillin      Kao-Pectate  Zithromax      Imodium AD         PUSH FLUIDS AVOID:       Cipro     Fever:  Tetracycline      Tylenol (Regular  or Extra  Minocycline       Strength)  Levaquin      Extra Strength-Do not          Exceed 8 tabs/24 hrs Caffeine:        <273m/day (equiv. To 1 cup of coffee or  approx. 3 12 oz sodas)         Gas: Cold/Hayfever:       Gas-X  Benadryl      Mylicon  Claritin       Phazyme  **Claritin-D        Chlor-Trimeton    Headaches:  Dimetapp      ASA-Free Excedrin  Drixoral-Non-Drowsy     Cold Compress  Mucinex (Guaifenasin)     Tylenol (Regular or Extra  Sudafed/Sudafed-12 Hour     Strength)  **Sudafed PE Pseudoephedrine   Tylenol Cold & Sinus     Vicks Vapor Rub  Zyrtec  **AVOID if Problems With Blood Pressure         Heartburn: Avoid lying down for at least 1 hour after meals  Aciphex      Maalox     Rash:  Milk of Magnesia     Benadryl    Mylanta       1% Hydrocortisone Cream  Pepcid  Pepcid Complete   Sleep Aids:  Prevacid      Ambien   Prilosec       Benadryl  Rolaids       Chamomile Tea  Tums (Limit 4/day)     Unisom  Tylenol PM         Warm milk-add vanilla or  Hemorrhoids:       Sugar for taste  Anusol/Anusol H.C.  (RX: Analapram 2.5%)  Sugar Substitutes:  Hydrocortisone OTC     Ok in moderation  Preparation H      Tucks        Vaseline lotion applied to tissue with wiping    Herpes:     Throat:  Acyclovir      Oragel  Famvir  Valtrex     Vaccines:         Flu Shot Leg Cramps:       *Gardasil  Benadryl      Hepatitis A         Hepatitis B Nasal Spray:       Pneumovax  Saline Nasal Spray     Polio Booster         Tetanus Nausea:       Tuberculosis test or PPD  Vitamin B6 25 mg TID   AVOID:    Dramamine      *Gardasil  Emetrol       Live Poliovirus  Ginger Root 250 mg QID    MMR (measles, mumps &  High Complex Carbs @ Bedtime    rebella)  Sea Bands-Accupressure    Varicella (Chickenpox)  Unisom 1/2 tab TID     *No known complications           If received before Pain:         Known pregnancy;   Darvocet       Resume series  after  Lortab        Delivery  Percocet    Yeast:   Tramadol      Femstat  Tylenol 3      Gyne-lotrimin  Ultram       Monistat  Vicodin           MISC:         All Sunscreens           Hair Coloring/highlights          Insect Repellant's          (Including DEET)         Mystic Tans

## 2022-02-03 NOTE — L&D Delivery Note (Signed)
      Delivery Note   [name] is a 27 y.o. G1P0000 at [redacted]w[redacted]d Estimated Date of Delivery: 09/07/22  PRE-OPERATIVE DIAGNOSIS:  1) [redacted]w[redacted]d pregnancy.   POST-OPERATIVE DIAGNOSIS:  1) [redacted]w[redacted]d pregnancy s/p Vaginal, Spontaneous  Delivery Type: Vaginal, Spontaneous   Delivery Anesthesia: None;Epidural  Labor Complications:  none    ESTIMATED BLOOD LOSS: 400 ml    FINDINGS:   1) female infant, Apgar scores of 8   at 1 minute and 9   at 5 minutes and a birthweight pending, infant skin to skin.    2) Nuchal cord: yes, loose . Easily reduced   SPECIMENS:   PLACENTA:   Appearance: Intact, , 3 vessel cord. Cord blood sample collected    Removal: Spontaneous     Disposition:  per protocol   DISPOSITION:  Infant to left in stable condition in the delivery room, with L&D personnel and mother,  NARRATIVE SUMMARY: Labor course:  Ms. Katherine Whitaker is a G1P0000 at [redacted]w[redacted]d who presented for induction of labor.  She progressed well in labor with cytotec, cooks catheter, and pitocin.  She received the appropriate epidural anesthesia and proceeded to complete dilation. She evidenced good maternal expulsive effort during the second stage. She went on to deliver a viable female  infant. The placenta delivered without problems and was noted to be complete. A perineal and vaginal examination was performed. Lacerations:  Bilateral labial abrasions . Right hemostatic not in need of repair. Left , repaired with 3-0 vicryl Rapide suture.  The patient tolerated this well.  Doreene Burke, CNM  09/12/2022 4:53 AM

## 2022-02-10 ENCOUNTER — Other Ambulatory Visit: Payer: BLUE CROSS/BLUE SHIELD

## 2022-02-12 ENCOUNTER — Other Ambulatory Visit: Payer: Self-pay | Admitting: Obstetrics and Gynecology

## 2022-02-12 ENCOUNTER — Ambulatory Visit (INDEPENDENT_AMBULATORY_CARE_PROVIDER_SITE_OTHER): Payer: BLUE CROSS/BLUE SHIELD

## 2022-02-12 DIAGNOSIS — Z3A09 9 weeks gestation of pregnancy: Secondary | ICD-10-CM | POA: Diagnosis not present

## 2022-02-12 DIAGNOSIS — Z369 Encounter for antenatal screening, unspecified: Secondary | ICD-10-CM

## 2022-02-12 DIAGNOSIS — Z3480 Encounter for supervision of other normal pregnancy, unspecified trimester: Secondary | ICD-10-CM

## 2022-02-12 DIAGNOSIS — Z3687 Encounter for antenatal screening for uncertain dates: Secondary | ICD-10-CM | POA: Diagnosis not present

## 2022-02-18 ENCOUNTER — Encounter: Payer: BLUE CROSS/BLUE SHIELD | Admitting: Obstetrics and Gynecology

## 2022-02-27 ENCOUNTER — Other Ambulatory Visit: Payer: BLUE CROSS/BLUE SHIELD

## 2022-02-27 ENCOUNTER — Encounter: Payer: Self-pay | Admitting: Obstetrics and Gynecology

## 2022-02-27 ENCOUNTER — Other Ambulatory Visit: Payer: Self-pay

## 2022-02-27 ENCOUNTER — Ambulatory Visit (INDEPENDENT_AMBULATORY_CARE_PROVIDER_SITE_OTHER): Payer: BLUE CROSS/BLUE SHIELD | Admitting: Obstetrics and Gynecology

## 2022-02-27 ENCOUNTER — Other Ambulatory Visit (HOSPITAL_COMMUNITY)
Admission: RE | Admit: 2022-02-27 | Discharge: 2022-02-27 | Disposition: A | Discharge status: Home / Self Care | Payer: BLUE CROSS/BLUE SHIELD | Source: Ambulatory Visit | Attending: Obstetrics and Gynecology | Admitting: Obstetrics and Gynecology

## 2022-02-27 VITALS — BP 109/69 | HR 77 | Wt 266.9 lb

## 2022-02-27 DIAGNOSIS — O99211 Obesity complicating pregnancy, first trimester: Secondary | ICD-10-CM | POA: Diagnosis not present

## 2022-02-27 DIAGNOSIS — O99281 Endocrine, nutritional and metabolic diseases complicating pregnancy, first trimester: Secondary | ICD-10-CM | POA: Diagnosis not present

## 2022-02-27 DIAGNOSIS — O99282 Endocrine, nutritional and metabolic diseases complicating pregnancy, second trimester: Secondary | ICD-10-CM

## 2022-02-27 DIAGNOSIS — O99210 Obesity complicating pregnancy, unspecified trimester: Secondary | ICD-10-CM | POA: Diagnosis not present

## 2022-02-27 DIAGNOSIS — E282 Polycystic ovarian syndrome: Secondary | ICD-10-CM

## 2022-02-27 DIAGNOSIS — Z6791 Unspecified blood type, Rh negative: Secondary | ICD-10-CM

## 2022-02-27 DIAGNOSIS — Z3401 Encounter for supervision of normal first pregnancy, first trimester: Secondary | ICD-10-CM

## 2022-02-27 DIAGNOSIS — O99212 Obesity complicating pregnancy, second trimester: Secondary | ICD-10-CM

## 2022-02-27 DIAGNOSIS — Z3480 Encounter for supervision of other normal pregnancy, unspecified trimester: Secondary | ICD-10-CM | POA: Insufficient documentation

## 2022-02-27 DIAGNOSIS — Z369 Encounter for antenatal screening, unspecified: Secondary | ICD-10-CM

## 2022-02-27 DIAGNOSIS — Z3A12 12 weeks gestation of pregnancy: Secondary | ICD-10-CM

## 2022-02-27 DIAGNOSIS — O360110 Maternal care for anti-D [Rh] antibodies, first trimester, not applicable or unspecified: Secondary | ICD-10-CM

## 2022-02-27 NOTE — Progress Notes (Addendum)
OBSTETRIC INITIAL PRENATAL VISIT  Subjective:    Katherine Whitaker is being seen today for her first obstetrical visit.  This is a planned pregnancy. She is a 27 y.o. G1P0000 female at [redacted]w[redacted]d gestation, Estimated Date of Delivery: 09/07/22 with Patient's last menstrual period was 12/01/2021 (exact date), consistent with 9 week sono. Her obstetrical history is significant for obesity and PCOS . Relationship with FOB: spouse, living together. Patient does intend to breast feed. Pregnancy history fully reviewed.    OB History  Gravida Para Term Preterm AB Living  1 0 0 0 0 0  SAB IAB Ectopic Multiple Live Births  0 0 0 0 0    # Outcome Date GA Lbr Len/2nd Weight Sex Delivery Anes PTL Lv  1 Current             Gynecologic History:  Last pap smear was 07/10/2021.  Results were Normal.  Denies h/o abnormal pap smears in the past.  denies history of STIs.  Contraception prior to conception: OCP   Past Medical History:  Diagnosis Date   PCOS (polycystic ovarian syndrome)     Family History  Problem Relation Age of Onset   Healthy Mother    Healthy Father    Seizures Sister    Healthy Brother    Diabetes Maternal Grandmother    Alzheimer's disease Maternal Grandmother    Bipolar disorder Maternal Grandfather    Melanoma Paternal Grandmother    Melanoma Paternal Grandfather    Heart disease Paternal Grandfather    Polycystic ovary syndrome Other        Paternal First Cousin   Alzheimer's disease Other        [place]    Past Surgical History:  Procedure Laterality Date   KNEE SURGERY  2011    Social History   Socioeconomic History   Marital status: Married    Spouse name: Rodman Key   Number of children: 0   Years of education: 18   Highest education level: Not on file  Occupational History   Occupation: Pharmacist, hospital  Tobacco Use   Smoking status: Never   Smokeless tobacco: Never  Vaping Use   Vaping Use: Never used  Substance and Sexual Activity   Alcohol use: Not  Currently    Comment: Less than 1 week   Drug use: No   Sexual activity: Yes    Partners: Male    Birth control/protection: None  Other Topics Concern   Not on file  Social History Narrative   Not on file   Social Determinants of Health   Financial Resource Strain: Low Risk  (01/23/2022)   Overall Financial Resource Strain (CARDIA)    Difficulty of Paying Living Expenses: Not hard at all  Food Insecurity: No Food Insecurity (01/23/2022)   Hunger Vital Sign    Worried About Running Out of Food in the Last Year: Never true    Ran Out of Food in the Last Year: Never true  Transportation Needs: No Transportation Needs (01/23/2022)   PRAPARE - Hydrologist (Medical): No    Lack of Transportation (Non-Medical): No  Physical Activity: Unknown (01/23/2022)   Exercise Vital Sign    Days of Exercise per Week: 2 days    Minutes of Exercise per Session: Not on file  Stress: No Stress Concern Present (01/23/2022)   [place]    Feeling of Stress : Not at all  Social Connections: Moderately  Integrated (01/23/2022)   Social Connection and Isolation Panel [NHANES]    Frequency of Communication with Friends and Family: More than three times a week    Frequency of Social Gatherings with Friends and Family: Once a week    Attends Religious Services: More than 4 times per year    Active Member of [employer] or Organizations: No    Attends Banker Meetings: Never    Marital Status: Married  Catering manager Violence: Not At Risk (01/23/2022)   Humiliation, Afraid, Rape, and Kick questionnaire    Fear of Current or Ex-Partner: No    Emotionally Abused: No    Physically Abused: No    Sexually Abused: No    Current Outpatient Medications on File Prior to Visit  Medication Sig Dispense Refill   Cyanocobalamin (B-12 PO) Take by mouth.     Prenatal Vit-Fe Fumarate-FA (PRENATAL PO) Take by  mouth.     VITAMIN D PO Take by mouth.     No current facility-administered medications on file prior to visit.    No Known Allergies   Review of Systems General: Not Present- Fever, Weight Loss and Weight Gain. Skin: Not Present- Rash. HEENT: Not Present- Blurred Vision, Headache and Bleeding Gums. Respiratory: Not Present- Difficulty Breathing. Breast: Not Present- Breast Mass. Cardiovascular: Not Present- Chest Pain, Elevated Blood Pressure, Fainting / Blacking Out and Shortness of Breath. Gastrointestinal: Not Present- Abdominal Pain, Nausea and Vomiting.  Present - Constipation Female Genitourinary: Not Present- Frequency, Painful Urination, Pelvic Pain, Vaginal Bleeding, Vaginal Discharge, Contractions, regular, Fetal Movements Decreased, Urinary Complaints and Vaginal Fluid. Musculoskeletal: Not Present- Back Pain and Leg Cramps. Neurological: Not Present- Dizziness. Psychiatric: Not Present- Depression.     Objective:   Blood pressure 109/69, pulse 77, weight 266 lb 14.4 oz (121.1 kg), last menstrual period 12/01/2021.  Body mass index is 47.28 kg/m.  General Appearance:    Alert, cooperative, no distress, appears stated age  Head:    Normocephalic, without obvious abnormality, atraumatic  Eyes:    PERRL, conjunctiva/corneas clear, EOM's intact, both eyes  Ears:    Normal external ear canals, both ears  Nose:   Nares normal, septum midline, mucosa normal, no drainage or sinus tenderness  Throat:   Lips, mucosa, and tongue normal; teeth and gums normal  Neck:   Supple, symmetrical, trachea midline, no adenopathy; thyroid: no enlargement/tenderness/nodules; no carotid bruit or JVD  Back:     Symmetric, no curvature, ROM normal, no CVA tenderness  Lungs:     Clear to auscultation bilaterally, respirations unlabored  Chest Wall:    No tenderness or deformity   Heart:    Regular rate and rhythm, S1 and S2 normal, no murmur, rub or gallop  Breast Exam:    No tenderness,  masses, or nipple abnormality  Abdomen:     Soft, non-tender, bowel sounds active all four quadrants, no masses, no organomegaly.  FHT 165 bpm.  Genitalia:    Pelvic:external genitalia normal, vagina without lesions, discharge, or tenderness, rectovaginal septum  normal. Cervix normal in appearance, no cervical motion tenderness, no adnexal masses or tenderness.  Pregnancy positive findings: uterine enlargement: 12 wk size, nontender.   Rectal:    Normal external sphincter.  No hemorrhoids appreciated. Internal exam not done.   Extremities:   Extremities normal, atraumatic, no cyanosis or edema  Pulses:   2+ and symmetric all extremities  Skin:   Skin color, texture, turgor normal, no rashes or lesions  Lymph nodes:   Cervical,  supraclavicular, and axillary nodes normal  Neurologic:   CNII-XII intact, normal strength, sensation and reflexes throughout     Assessment:   1. [redacted] weeks gestation of pregnancy   2. Encounter for supervision of normal first pregnancy in first trimester   3. Obesity in pregnancy   4. Obesity, morbid, BMI 40.0-49.9 ([place])   5. PCOS (polycystic ovarian syndrome)   6. Rh negative state in antepartum period     Plan:   Supervision of normal first pregnancy  - Initial labs reviewed.  Needs UDS/Urine culture performed next visit, unable to void today.  - Prenatal vitamins encouraged. - Problem list reviewed and updated. - New OB counseling:  The patient has been given an overview regarding routine prenatal care.  Recommendations regarding diet, weight gain, and exercise in pregnancy were given. - Prenatal testing, optional genetic testing, and ultrasound use in pregnancy were reviewed.  Traditional genetic screening vs cell-fee DNA genetic screening discussed, including risks and benefits. Testing ordered.  Declined carrier screen.  - Benefits of Breast Feeding were discussed. The patient is encouraged to consider nursing her baby post partum.   2. Obesity in  pregnancy - Recommend beginning daily baby aspirin.   - TSH - Hemoglobin A1c - For Anesthesia consult in 3rd trimester.  - Discussed antenatal surveillance and serial growth scans beginning in 3rd trimester.   3. PCOS (polycystic ovarian syndrome) - Patient previously on Metformin for fertility. Has discontinued at this time.  - Discussed PCOS and obesity places patient at higher risk for conditions such as hypertensive disorders of pregnancy and diabetes.   4. Rh negative state in antepartum period - Rh negative, for Rhogam at 28 weeks, or sooner if bleeding occurs.    Follow up in 4 weeks.    Rubie Maid, MD [place]

## 2022-02-27 NOTE — Patient Instructions (Signed)
WHAT OB PATIENTS CAN EXPECT  Confirmation of pregnancy and ultrasound ordered if medically indicated-[redacted] weeks gestation New OB (NOB) intake with nurse and New OB (NOB) labs- [redacted] weeks gestation New OB (NOB) physical examination with provider- 11/[redacted] weeks gestation Flu vaccine-[redacted] weeks gestation Anatomy scan-[redacted] weeks gestation Glucose tolerance test, blood work to test for anemia, T-dap vaccine-[redacted] weeks gestation Vaginal swabs/cultures-STD/Group B strep-[redacted] weeks gestation Appointments every 4 weeks until 28 weeks Every 2 weeks from 28 weeks until 36 weeks Weekly visits from 36 weeks until delivery   

## 2022-02-28 LAB — CBC/D/PLT+RPR+RH+ABO+RUBIGG...
Antibody Screen: NEGATIVE
Basophils Absolute: 0 10*3/uL (ref 0.0–0.2)
Basos: 0 %
EOS (ABSOLUTE): 0.1 10*3/uL (ref 0.0–0.4)
Eos: 1 %
HCV Ab: NONREACTIVE
HIV Screen 4th Generation wRfx: NONREACTIVE
Hematocrit: 38 % (ref 34.0–46.6)
Hemoglobin: 12.1 g/dL (ref 11.1–15.9)
Hepatitis B Surface Ag: NEGATIVE
Immature Grans (Abs): 0 10*3/uL (ref 0.0–0.1)
Immature Granulocytes: 0 %
Lymphocytes Absolute: 2.2 10*3/uL (ref 0.7–3.1)
Lymphs: 22 %
MCH: 26.3 pg — ABNORMAL LOW (ref 26.6–33.0)
MCHC: 31.8 g/dL (ref 31.5–35.7)
MCV: 83 fL (ref 79–97)
Monocytes Absolute: 0.4 10*3/uL (ref 0.1–0.9)
Monocytes: 4 %
Neutrophils Absolute: 7.5 10*3/uL — ABNORMAL HIGH (ref 1.4–7.0)
Neutrophils: 73 %
Platelets: 257 10*3/uL (ref 150–450)
RBC: 4.6 x10E6/uL (ref 3.77–5.28)
RDW: 13.9 % (ref 11.7–15.4)
RPR Ser Ql: NONREACTIVE
Rh Factor: NEGATIVE
Rubella Antibodies, IGG: 1.63 index (ref >0.99)
Varicella zoster IgG: 1383 index (ref >165)
WBC: 10.3 10*3/uL (ref 3.4–10.8)

## 2022-02-28 LAB — URINALYSIS, ROUTINE W REFLEX MICROSCOPIC
Bilirubin, UA: NEGATIVE
Glucose, UA: NEGATIVE
Leukocytes,UA: NEGATIVE
Nitrite, UA: NEGATIVE
Protein,UA: NEGATIVE
RBC, UA: NEGATIVE
Specific Gravity, UA: 1.021 (ref 1.005–1.030)
Urobilinogen, Ur: 0.2 mg/dL (ref 0.2–1.0)
pH, UA: 6 (ref 5.0–7.5)

## 2022-02-28 LAB — HCV INTERPRETATION

## 2022-02-28 LAB — URINE CYTOLOGY ANCILLARY ONLY
Chlamydia: NEGATIVE
Comment: NEGATIVE
Comment: NORMAL
Neisseria Gonorrhea: NEGATIVE

## 2022-03-01 LAB — URINE CULTURE, OB REFLEX

## 2022-03-01 LAB — TSH: TSH: 0.884 u[IU]/mL (ref 0.450–4.500)

## 2022-03-01 LAB — SPECIMEN STATUS REPORT

## 2022-03-01 LAB — HEMOGLOBIN A1C
Est. average glucose Bld gHb Est-mCnc: 97 mg/dL
Hgb A1c MFr Bld: 5 % (ref 4.8–5.6)

## 2022-03-01 LAB — CULTURE, OB URINE

## 2022-03-04 LAB — MATERNIT 21 PLUS CORE, BLOOD
Fetal Fraction: 4
Result (T21): NEGATIVE
Trisomy 13 (Patau syndrome): NEGATIVE
Trisomy 18 (Edwards syndrome): NEGATIVE
Trisomy 21 (Down syndrome): NEGATIVE

## 2022-03-14 ENCOUNTER — Telehealth: Payer: Self-pay

## 2022-03-27 ENCOUNTER — Ambulatory Visit (INDEPENDENT_AMBULATORY_CARE_PROVIDER_SITE_OTHER): Payer: BLUE CROSS/BLUE SHIELD | Admitting: Obstetrics

## 2022-03-27 VITALS — BP 116/67 | HR 81 | Wt 269.6 lb

## 2022-03-27 DIAGNOSIS — Z3480 Encounter for supervision of other normal pregnancy, unspecified trimester: Secondary | ICD-10-CM

## 2022-03-27 DIAGNOSIS — Z3482 Encounter for supervision of other normal pregnancy, second trimester: Secondary | ICD-10-CM

## 2022-03-27 DIAGNOSIS — Z3A16 16 weeks gestation of pregnancy: Secondary | ICD-10-CM

## 2022-03-27 DIAGNOSIS — Z369 Encounter for antenatal screening, unspecified: Secondary | ICD-10-CM

## 2022-03-27 DIAGNOSIS — Z0283 Encounter for blood-alcohol and blood-drug test: Secondary | ICD-10-CM | POA: Diagnosis not present

## 2022-03-27 DIAGNOSIS — Z0189 Encounter for other specified special examinations: Secondary | ICD-10-CM | POA: Diagnosis not present

## 2022-03-27 LAB — POCT URINALYSIS DIPSTICK OB
Bilirubin, UA: NEGATIVE
Blood, UA: NEGATIVE
Glucose, UA: NEGATIVE
Ketones, UA: NEGATIVE
Leukocytes, UA: NEGATIVE
Nitrite, UA: NEGATIVE
POC,PROTEIN,UA: NEGATIVE
Spec Grav, UA: 1.01 (ref 1.010–1.025)
Urobilinogen, UA: 0.2 E.U./dL
pH, UA: 6 (ref 5.0–8.0)

## 2022-03-27 NOTE — Progress Notes (Signed)
Routine Prenatal Care Visit  Subjective  Katherine Whitaker is a 27 y.o. G1P0000 at 38w4dbeing seen today for ongoing prenatal care.  She is currently monitored for the following issues for this high-risk pregnancy and has Supervision of other normal pregnancy, antepartum on their problem list.  ----------------------------------------------------------------------------------- Patient reports no complaints.   Contractions: Not present. Vag. Bleeding: None.  Movement: Absent. Leaking Fluid denies.  ----------------------------------------------------------------------------------- The following portions of the patient's history were reviewed and updated as appropriate: allergies, current medications, past family history, past medical history, past social history, past surgical history and problem list. Problem list updated.  Objective  Blood pressure 116/67, pulse 81, weight 269 lb 9.6 oz (122.3 kg), last menstrual period 12/01/2021. Pregravid weight 260 lb (117.9 kg) Total Weight Gain 9 lb 9.6 oz (4.355 kg) Urinalysis: Urine Protein Negative  Urine Glucose Negative  Fetal Status: Fetal Heart Rate (bpm): 142   Movement: Absent     General:  Alert, oriented and cooperative. Patient is in no acute distress.  Skin: Skin is warm and dry. No rash noted.   Cardiovascular: Normal heart rate noted  Respiratory: Normal respiratory effort, no problems with respiration noted  Abdomen: Soft, gravid, appropriate for gestational age. Pain/Pressure: Absent     Pelvic:  Cervical exam deferred        Extremities: Normal range of motion.  Edema: None  Mental Status: Normal mood and affect. Normal behavior. Normal judgment and thought content.   Assessment   27y.o. G1P0000 at 153w4dy  09/07/2022, by Last Menstrual Period presenting for routine prenatal visit  Plan   first Problems (from 01/23/22 to present)     Problem Noted Resolved   Supervision of other normal pregnancy, antepartum 01/23/2022  by JoCleophas DunkerCMA No   Overview Addendum 03/27/2022  2:45 PM by FrImagene RichesCNM     Clinical Staff Provider  Office Location  [facility] Dating  Not found.  Language  English Anatomy [place]  Flu Vaccine  offer Genetic Screen  NIPS:   TDaP vaccine   offer Hgb A1C or  GTT Early : Third trimester :   Covid UTD    LAB RESULTS   Rhogam  A/Negative/-- (01/25 1005)  Blood Type A/Negative/-- (01/25 1005)   Feeding Plan breast Antibody Negative (01/25 1005)  Contraception undecided Rubella 1.63 (01/25 1005)  Circumcision yes RPR Non Reactive (01/25 1005)   Pediatrician  undecided HBsAg Negative (01/25 1005)   Support Person MaRodman KeyIV Non Reactive (01/25 1005)  Prenatal Classes yes Varicella     GBS  (For PCN allergy, check sensitivities)   BTL Consent  Hep C Non Reactive (01/25 1005)   VBAC Consent  Pap Diagnosis  Date Value Ref Range Status  07/10/2021   Final   - Negative for intraepithelial lesion or malignancy (NILM)      Hgb Electro      CF      SMA                   Preterm labor symptoms and general obstetric precautions including but not limited to vaginal bleeding, contractions, leaking of fluid and fetal movement were reviewed in detail with the patient. Please refer to After Visit Summary for other counseling recommendations.  Anatomy scan ordered. Patient expressing interest in using a tub for labor, but not delivery. Discussed limited options, encouraged her to explore tub rental. May inquire on water birth.  She is aware she will likely  have growth scans due to high BMI.  Return in about 4 weeks (around 04/24/2022) for return OB.  Imagene Riches, CNM  03/27/2022 3:14 PM

## 2022-03-29 LAB — MONITOR DRUG PROFILE 14(MW)
Amphetamine Scrn, Ur: NEGATIVE ng/mL
BARBITURATE SCREEN URINE: NEGATIVE ng/mL
BENZODIAZEPINE SCREEN, URINE: NEGATIVE ng/mL
Buprenorphine, Urine: NEGATIVE ng/mL
CANNABINOIDS UR QL SCN: NEGATIVE ng/mL
Cocaine (Metab) Scrn, Ur: NEGATIVE ng/mL
Creatinine(Crt), U: 130.5 mg/dL (ref 20.0–300.0)
Fentanyl, Urine: NEGATIVE pg/mL
Meperidine Screen, Urine: NEGATIVE ng/mL
Methadone Screen, Urine: NEGATIVE ng/mL
OXYCODONE+OXYMORPHONE UR QL SCN: NEGATIVE ng/mL
Opiate Scrn, Ur: NEGATIVE ng/mL
Ph of Urine: 5.9 (ref 4.5–8.9)
Phencyclidine Qn, Ur: NEGATIVE ng/mL
Propoxyphene Scrn, Ur: NEGATIVE ng/mL
SPECIFIC GRAVITY: 1.021
Tramadol Screen, Urine: NEGATIVE ng/mL

## 2022-03-29 LAB — NICOTINE SCREEN, URINE: Cotinine Ql Scrn, Ur: NEGATIVE ng/mL

## 2022-04-11 ENCOUNTER — Other Ambulatory Visit: Payer: Self-pay

## 2022-04-11 DIAGNOSIS — Z3689 Encounter for other specified antenatal screening: Secondary | ICD-10-CM

## 2022-04-11 DIAGNOSIS — Z3686 Encounter for antenatal screening for cervical length: Secondary | ICD-10-CM

## 2022-04-18 ENCOUNTER — Telehealth: Payer: Self-pay

## 2022-04-18 NOTE — Telephone Encounter (Signed)
Patient states symptoms of nausea, vomiting and diarrhea for the past 3 days. Patient states she has  been able to tolerate liquids for the last 12 hours. No signs of dehydration are present. She denies being around anyone sick at this time.   Plan: Advise saltine crackers before getting out of bed, eating small bland meals, do not allow and empty or overly full stomach, avoid greasy, acidic or spicy foods, try ginger 250 mg 4xday or ginger tea and to try B6 3xday along with Unisom. Discussed if patient starts to become dehydrated or is unable to get liquids down for 12 hours, to go to L&D to be evaluated.

## 2022-04-24 ENCOUNTER — Other Ambulatory Visit: Payer: BLUE CROSS/BLUE SHIELD

## 2022-04-24 ENCOUNTER — Ambulatory Visit
Admission: RE | Admit: 2022-04-24 | Discharge: 2022-04-24 | Disposition: A | Discharge status: Home / Self Care | Payer: BLUE CROSS/BLUE SHIELD | Source: Ambulatory Visit | Attending: Obstetrics | Admitting: Obstetrics

## 2022-04-24 ENCOUNTER — Encounter: Payer: Self-pay | Admitting: Obstetrics

## 2022-04-24 ENCOUNTER — Ambulatory Visit (INDEPENDENT_AMBULATORY_CARE_PROVIDER_SITE_OTHER): Payer: BLUE CROSS/BLUE SHIELD | Admitting: Obstetrics

## 2022-04-24 VITALS — BP 121/69 | HR 98 | Wt 261.0 lb

## 2022-04-24 DIAGNOSIS — Z3402 Encounter for supervision of normal first pregnancy, second trimester: Secondary | ICD-10-CM

## 2022-04-24 DIAGNOSIS — Z3686 Encounter for antenatal screening for cervical length: Secondary | ICD-10-CM | POA: Diagnosis not present

## 2022-04-24 DIAGNOSIS — Z3689 Encounter for other specified antenatal screening: Secondary | ICD-10-CM | POA: Diagnosis not present

## 2022-04-24 DIAGNOSIS — Z3A20 20 weeks gestation of pregnancy: Secondary | ICD-10-CM

## 2022-04-24 LAB — POCT URINALYSIS DIPSTICK OB
Bilirubin, UA: NEGATIVE
Blood, UA: NEGATIVE
Glucose, UA: NEGATIVE
Ketones, UA: NEGATIVE
Leukocytes, UA: NEGATIVE
Nitrite, UA: NEGATIVE
POC,PROTEIN,UA: NEGATIVE
Spec Grav, UA: 1.02 (ref 1.010–1.025)
Urobilinogen, UA: 0.2 E.U./dL
pH, UA: 6.5 (ref 5.0–8.0)

## 2022-04-24 NOTE — Progress Notes (Signed)
ROB at [redacted]w[redacted]d. Starting to feel some fetal movement. Avalena has a cold right now but is otherwise well. Anticipatory guidance about the second trimester. Encouraged CBE, BF class. Shamyia is concerned that her baby is very low and her mother has a h/o PTB x 3. Will evaluate cervical length at anatomy US today. Anesthesia consult scheduled. RTC in 4 weeks.  Lurlean Horns, CNM

## 2022-04-24 NOTE — Addendum Note (Signed)
Addended by: Landis Gandy on: 04/24/2022 10:06 AM   Modules accepted: Orders

## 2022-05-06 ENCOUNTER — Encounter
Admission: RE | Admit: 2022-05-06 | Discharge: 2022-05-06 | Disposition: A | Discharge status: Home / Self Care | Payer: BLUE CROSS/BLUE SHIELD | Source: Ambulatory Visit | Attending: Anesthesiology | Admitting: Anesthesiology

## 2022-05-06 NOTE — Consult Note (Signed)
[facility] Anesthesia Consultation  Katherine Whitaker [id number] DOB: 1995-10-06 DOA: 05/06/2022 PCP: Patient, No Pcp Per   Requesting physician: Dr. Marcelline Mates Date of consultation: 05/06/22 Reason for consultation: Obesity during pregnancy  CHIEF COMPLAINT:  Obesity during pregnancy  HISTORY OF PRESENT ILLNESS: Katherine Whitaker  is a 27 y.o. female with a known history of PCOS and morbid obesity who is presenting for airway evaluation to determine appropriateness of delivery at [place]:   Past Medical History:  Diagnosis Date   PCOS (polycystic ovarian syndrome)     PAST SURGICAL HISTORY:  Past Surgical History:  Procedure Laterality Date   KNEE SURGERY  2011    SOCIAL HISTORY:  Social History   Tobacco Use   Smoking status: Never   Smokeless tobacco: Never  Substance Use Topics   Alcohol use: Not Currently    Comment: Less than 1 week    FAMILY HISTORY:  Family History  Problem Relation Age of Onset   Healthy Mother    Healthy Father    Seizures Sister    Healthy Brother    Diabetes Maternal Grandmother    Alzheimer's disease Maternal Grandmother    Bipolar disorder Maternal Grandfather    Melanoma Paternal Grandmother    Melanoma Paternal Grandfather    Heart disease Paternal Grandfather    Polycystic ovary syndrome Other        Paternal First Cousin   Alzheimer's disease Other        MGGM    DRUG ALLERGIES: No Known Allergies  REVIEW OF SYSTEMS:   RESPIRATORY: No cough, shortness of breath, wheezing.  CARDIOVASCULAR: No chest pain, orthopnea, edema.  HEMATOLOGY: No anemia, easy bruising or bleeding SKIN: No rash or lesion. NEUROLOGIC: No tingling, numbness, weakness.  PSYCHIATRY: No anxiety or depression.   MEDICATIONS AT HOME:  Prior to Admission medications   Medication Sig Start Date End Date Taking? Authorizing Provider  Cyanocobalamin (B-12 PO) Take by mouth.    [provider]  Prenatal Vit-Fe Fumarate-FA (PRENATAL PO) Take by mouth.    [provider]  VITAMIN D PO Take by mouth.    [provider]      PHYSICAL EXAMINATION:   VITAL SIGNS: Last menstrual period 12/01/2021.  GENERAL:  27 y.o.-year-old patient no acute distress.  HEENT: Head atraumatic, normocephalic. Oropharynx and nasopharynx clear. MP 1, TM distance >3 cm, normal mouth opening. LUNGS: No use of accessory muscles of respiration.   EXTREMITIES: No pedal edema, cyanosis, or clubbing.  NEUROLOGIC: normal gait PSYCHIATRIC: The patient is alert and oriented x 3.  SKIN: No obvious rash, lesion, or ulcer.    IMPRESSION AND PLAN:   Katherine Whitaker  is a 27 y.o. female presenting with obesity during pregnancy. BMI is currently 46.2 at [redacted] weeks gestation.   We discussed analgesic options during labor including epidural analgesia. Discussed that in obesity there can be increased difficulty with epidural placement or even failure of successful epidural. We also discussed that even after successful epidural placement there is increased risk of catheter migration out of the epidural space that would require catheter replacement. Discussed use of epidural vs spinal vs GA if cesarean delivery is required. Discussed increased risk of difficult intubation during pregnancy should an emergency cesarean delivery be required.  Patient is appropriate for delivery at [facility] and we will see her in the future for her delivery.  Thank you for this consult

## 2022-05-22 ENCOUNTER — Ambulatory Visit (INDEPENDENT_AMBULATORY_CARE_PROVIDER_SITE_OTHER): Payer: BLUE CROSS/BLUE SHIELD | Admitting: Obstetrics

## 2022-05-22 ENCOUNTER — Encounter: Payer: Self-pay | Admitting: Obstetrics

## 2022-05-22 VITALS — BP 112/61 | HR 63 | Wt 268.2 lb

## 2022-05-22 DIAGNOSIS — Z3402 Encounter for supervision of normal first pregnancy, second trimester: Secondary | ICD-10-CM

## 2022-05-22 DIAGNOSIS — Z3A24 24 weeks gestation of pregnancy: Secondary | ICD-10-CM

## 2022-05-22 DIAGNOSIS — Z3480 Encounter for supervision of other normal pregnancy, unspecified trimester: Secondary | ICD-10-CM

## 2022-05-22 LAB — POCT URINALYSIS DIPSTICK OB
Bilirubin, UA: NEGATIVE
Blood, UA: NEGATIVE
Glucose, UA: NEGATIVE
Ketones, UA: NEGATIVE
Leukocytes, UA: NEGATIVE
Nitrite, UA: NEGATIVE
POC,PROTEIN,UA: NEGATIVE
Spec Grav, UA: <=1.005 — AB (ref 1.010–1.025)
Urobilinogen, UA: 0.2 E.U./dL
pH, UA: 6.5 (ref 5.0–8.0)

## 2022-05-22 NOTE — Progress Notes (Signed)
Body mass index is 47.51 kg/m.

## 2022-05-22 NOTE — Progress Notes (Signed)
Routine Prenatal Care Visit  Subjective  Katherine Whitaker is a 27 y.o. G1P0000 at [redacted]w[redacted]d being seen today for ongoing prenatal care.  She is currently monitored for the following issues for this high-risk pregnancy and has Supervision of other normal pregnancy, antepartum on their problem list.  ----------------------------------------------------------------------------------- Patient reports no complaints.  She is naming her son Marvis Moeller. He is oving well. 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 Is planning on having a doula for her labor and birth. Contractions: Not present. Vag. Bleeding: None.  Movement: Present. Leaking Fluid denies.  ----------------------------------------------------------------------------------- The following portions of the patient's history were reviewed and updated as appropriate: allergies, current medications, past family history, past medical history, past social history, past surgical history and problem list. Problem list updated.  Objective  Blood pressure 112/61, pulse 63, weight 268 lb 3.2 oz (121.7 kg), last menstrual period 12/01/2021. Pregravid weight 260 lb (117.9 kg) Total Weight Gain 8 lb 3.2 oz (3.719 kg) Urinalysis: Urine Protein Negative  Urine Glucose Negative  Fetal Status:     Movement: Present     General:  Alert, oriented and cooperative. Patient is in no acute distress.  Skin: Skin is warm and dry. No rash noted.   Cardiovascular: Normal heart rate noted  Respiratory: Normal respiratory effort, no problems with respiration noted  Abdomen: Soft, gravid, appropriate for gestational age. Pain/Pressure: Present     Pelvic:  Cervical exam deferred        Extremities: Normal range of motion.  Edema: None  Mental Status: Normal mood and affect. Normal behavior. Normal judgment and thought content.   Assessment   27 y.o. G1P0000 at [redacted]w[redacted]d by  09/07/2022, by Last Menstrual Period presenting for routine prenatal visit High BMI   Plan   first  Problems (from 01/23/22 to present)     Problem Noted Resolved   Supervision of other normal pregnancy, antepartum 01/23/2022 by Loran Senters, CMA No   Overview Addendum 05/22/2022 11:36 AM by Mirna Mires, CNM     Clinical Staff Provider  Office Location  [facility] Dating  Not found.  Language  English Anatomy US    Flu Vaccine  offer Genetic Screen  NIPS: negative, xy  TDaP vaccine   offer Hgb A1C or  GTT Early : Third trimester :   Covid UTD    LAB RESULTS   Rhogam  A/Negative/-- (01/25 1005)  Blood Type A/Negative/-- (01/25 1005)   Feeding Plan breast Antibody Negative (01/25 1005)  Contraception undecided Rubella 1.63 (01/25 1005)  Circumcision yes RPR Non Reactive (01/25 1005)   Pediatrician  undecided HBsAg Negative (01/25 1005)   Support Person Molli Hazard HIV Non Reactive (01/25 1005)  Prenatal Classes yes Varicella     GBS  (For PCN allergy, check sensitivities)   BTL Consent  Hep C Non Reactive (01/25 1005)   VBAC Consent  Pap Diagnosis  Date Value Ref Range Status  07/10/2021   Final   - Negative for intraepithelial lesion or malignancy (NILM)      Hgb Electro      CF      SMA                   Preterm labor symptoms and general obstetric precautions including but not limited to vaginal bleeding, contractions, leaking of fluid and fetal movement were reviewed in detail with the patient. Please refer to After Visit Summary for other counseling recommendations.  Talked about getting prepared for labor and birth by  watching videos, reading, taking class. She will have a doula. I have ordered a growth scan for her for 28 weeks. She measures large for dates. Had her anesthesia consultation already  Return in about 4 weeks (around 06/19/2022) for return OB, 28 wk labs.  Mirna Mires, CNM  05/22/2022 11:37 AM

## 2022-05-26 ENCOUNTER — Telehealth: Payer: Self-pay

## 2022-05-26 NOTE — Telephone Encounter (Signed)
triage

## 2022-05-26 NOTE — Telephone Encounter (Signed)
Called Katherine Whitaker after receiving after hours nurse triage paper, Katherine Whitaker stated int he last two hours she felt baby move maybe two times. Throughout the day she noticed decreased movement. No vaginal bleeding or leakage. Some back pain and no changes as when she lays down. Was advised to go to L&D but nothing in chart as if she went. Called and left voicemail to return my call.

## 2022-06-20 ENCOUNTER — Ambulatory Visit (INDEPENDENT_AMBULATORY_CARE_PROVIDER_SITE_OTHER): Payer: BLUE CROSS/BLUE SHIELD | Admitting: Licensed Practical Nurse

## 2022-06-20 ENCOUNTER — Encounter: Payer: Self-pay | Admitting: Licensed Practical Nurse

## 2022-06-20 ENCOUNTER — Ambulatory Visit (INDEPENDENT_AMBULATORY_CARE_PROVIDER_SITE_OTHER): Payer: BLUE CROSS/BLUE SHIELD

## 2022-06-20 ENCOUNTER — Other Ambulatory Visit: Payer: BLUE CROSS/BLUE SHIELD

## 2022-06-20 VITALS — BP 116/82 | HR 80 | Wt 269.3 lb

## 2022-06-20 DIAGNOSIS — Z3403 Encounter for supervision of normal first pregnancy, third trimester: Secondary | ICD-10-CM

## 2022-06-20 DIAGNOSIS — Z3A28 28 weeks gestation of pregnancy: Secondary | ICD-10-CM | POA: Diagnosis not present

## 2022-06-20 DIAGNOSIS — Z3402 Encounter for supervision of normal first pregnancy, second trimester: Secondary | ICD-10-CM

## 2022-06-20 DIAGNOSIS — Z3480 Encounter for supervision of other normal pregnancy, unspecified trimester: Secondary | ICD-10-CM

## 2022-06-20 DIAGNOSIS — O360130 Maternal care for anti-D [Rh] antibodies, third trimester, not applicable or unspecified: Secondary | ICD-10-CM | POA: Diagnosis not present

## 2022-06-20 LAB — POCT URINALYSIS DIPSTICK
Bilirubin, UA: NEGATIVE
Blood, UA: NEGATIVE
Glucose, UA: NEGATIVE
Ketones, UA: NEGATIVE
Leukocytes, UA: NEGATIVE
Nitrite, UA: NEGATIVE
Protein, UA: NEGATIVE
Spec Grav, UA: 1.015 (ref 1.010–1.025)
Urobilinogen, UA: 0.2 E.U./dL
pH, UA: 7 (ref 5.0–8.0)

## 2022-06-20 MED ORDER — RHO D IMMUNE GLOBULIN 1500 UNIT/2ML IJ SOSY
300.0000 ug | PREFILLED_SYRINGE | Freq: Once | INTRAMUSCULAR | Status: AC
  Administered 2022-06-20: 300 ug via INTRAMUSCULAR

## 2022-06-20 NOTE — Progress Notes (Unsigned)
Routine Prenatal Care Visit  Subjective  Katherine Whitaker is a 27 y.o. G1P0000 at [redacted]w[redacted]d being seen today for ongoing prenatal care.  She is currently monitored for the following issues for this high-risk pregnancy and has Supervision of other normal pregnancy, antepartum on their problem list.  ----------------------------------------------------------------------------------- Patient reports no complaints.  Here with partner Molli Hazard Doing well. Is signed up for CBE and BF classes. Briefly discussed what to expect with breastfeeding, when to supplement. Her sister is currently breastfeeding and has a good supply, ok to use DBM from sister if needed.  -RH negative, Rhogam given today  -High BMI last growth 5/17 1171 grams, 40.8%, AFI 14.82   Contractions: Not present. Vag. Bleeding: None.  Movement: Present. Leaking Fluid denies.  ----------------------------------------------------------------------------------- The following portions of the patient's history were reviewed and updated as appropriate: allergies, current medications, past family history, past medical history, past social history, past surgical history and problem list. Problem list updated.  Objective  Weight 269 lb 4.8 oz (122.2 kg), last menstrual period 12/01/2021. Pregravid weight 260 lb (117.9 kg) Total Weight Gain 9 lb 4.8 oz (4.218 kg) Urinalysis: Urine Protein    Urine Glucose    Fetal Status: Fetal Heart Rate (bpm): 136   Movement: Present     General:  Alert, oriented and cooperative. Patient is in no acute distress.  Skin: Skin is warm and dry. No rash noted.   Cardiovascular: Normal heart rate noted  Respiratory: Normal respiratory effort, no problems with respiration noted  Abdomen: Soft, gravid, appropriate for gestational age. Pain/Pressure: Absent     Pelvic:  Cervical exam deferred        Extremities: Normal range of motion.     Mental Status: Normal mood and affect. Normal behavior. Normal judgment and  thought content.   Assessment   27 y.o. G1P0000 at [redacted]w[redacted]d by  09/07/2022, by Last Menstrual Period presenting for routine prenatal visit  Plan   first Problems (from 01/23/22 to present)     Problem Noted Resolved   Supervision of other normal pregnancy, antepartum 01/23/2022 by Loran Senters, CMA No   Overview Addendum 06/20/2022  9:48 AM by Ellwood Sayers, CNM     Clinical Staff Provider  Office Location  [facility] Dating  L and 9wk [place]   Language  English Anatomy US  normal  Flu Vaccine  offer Genetic Screen  NIPS: negative, xy  TDaP vaccine   offer Hgb A1C or  GTT Early : Third trimester :   Covid UTD    LAB RESULTS   Rhogam  A/Negative/-- (01/25 1005)  Blood Type A/Negative/-- (01/25 1005)   Feeding Plan breast Antibody Negative (01/25 1005)  Contraception undecided Rubella 1.63 (01/25 1005)  Circumcision yes RPR Non Reactive (01/25 1005)   Pediatrician  undecided HBsAg Negative (01/25 1005)   Support Person Molli Hazard HIV Non Reactive (01/25 1005)  Prenatal Classes yes Varicella Immune    GBS  (For PCN allergy, check sensitivities)   BTL Consent  Hep C Non Reactive (01/25 1005)   VBAC Consent  Pap Diagnosis  Date Value Ref Range Status  07/10/2021   Final   - Negative for intraepithelial lesion or malignancy (NILM)      Hgb Electro      CF      SMA                   Preterm labor symptoms and general obstetric precautions including but not limited to vaginal bleeding,  contractions, leaking of fluid and fetal movement were reviewed in detail with the patient. Please refer to After Visit Summary for other counseling recommendations.   Return in about 2 weeks (around 07/04/2022) for ROB.  28wk labs collected, Rhogam given TDAP next visit   Jannifer Hick   [facility]  06/22/22  3:44 PM

## 2022-06-21 LAB — 28 WEEK RH+PANEL
Basophils Absolute: 0 10*3/uL (ref 0.0–0.2)
Basos: 0 %
EOS (ABSOLUTE): 0.1 10*3/uL (ref 0.0–0.4)
Eos: 1 %
Gestational Diabetes Screen: 130 mg/dL (ref 70–139)
HIV Screen 4th Generation wRfx: NONREACTIVE
Hematocrit: 35.2 % (ref 34.0–46.6)
Hemoglobin: 11.5 g/dL (ref 11.1–15.9)
Immature Grans (Abs): 0 10*3/uL (ref 0.0–0.1)
Immature Granulocytes: 0 %
Lymphocytes Absolute: 1.9 10*3/uL (ref 0.7–3.1)
Lymphs: 16 %
MCH: 27.2 pg (ref 26.6–33.0)
MCHC: 32.7 g/dL (ref 31.5–35.7)
MCV: 83 fL (ref 79–97)
Monocytes Absolute: 0.3 10*3/uL (ref 0.1–0.9)
Monocytes: 3 %
Neutrophils Absolute: 9.5 10*3/uL — ABNORMAL HIGH (ref 1.4–7.0)
Neutrophils: 80 %
Platelets: 227 10*3/uL (ref 150–450)
RBC: 4.23 x10E6/uL (ref 3.77–5.28)
RDW: 14 % (ref 11.7–15.4)
RPR Ser Ql: NONREACTIVE
WBC: 11.9 10*3/uL — ABNORMAL HIGH (ref 3.4–10.8)

## 2022-07-04 ENCOUNTER — Ambulatory Visit (INDEPENDENT_AMBULATORY_CARE_PROVIDER_SITE_OTHER): Payer: BLUE CROSS/BLUE SHIELD | Admitting: Licensed Practical Nurse

## 2022-07-04 ENCOUNTER — Encounter: Payer: Self-pay | Admitting: Licensed Practical Nurse

## 2022-07-04 VITALS — BP 105/73 | HR 90 | Wt 268.0 lb

## 2022-07-04 DIAGNOSIS — Z3402 Encounter for supervision of normal first pregnancy, second trimester: Secondary | ICD-10-CM

## 2022-07-04 DIAGNOSIS — Z3A30 30 weeks gestation of pregnancy: Secondary | ICD-10-CM

## 2022-07-04 DIAGNOSIS — Z3403 Encounter for supervision of normal first pregnancy, third trimester: Secondary | ICD-10-CM

## 2022-07-04 LAB — POCT URINALYSIS DIPSTICK
Bilirubin, UA: NEGATIVE
Blood, UA: NEGATIVE
Glucose, UA: NEGATIVE
Ketones, UA: NEGATIVE
Leukocytes, UA: NEGATIVE
Nitrite, UA: NEGATIVE
Protein, UA: NEGATIVE
Spec Grav, UA: 1.02 (ref 1.010–1.025)
Urobilinogen, UA: 0.2 E.U./dL
pH, UA: 6 (ref 5.0–8.0)

## 2022-07-05 NOTE — Progress Notes (Signed)
Routine Prenatal Care Visit  Subjective  Katherine Whitaker is a 27 y.o. G1P0000 at [redacted]w[redacted]d being seen today for ongoing prenatal care.  She is currently monitored for the following issues for this high-risk pregnancy and has Supervision of other normal pregnancy, antepartum on their problem list.  ----------------------------------------------------------------------------------- Patient reports heartburn resolves with calcium tablets.  -doing well, feels good  -questions answered regarding things that can ready the body for labor, pt would prefer to avoid induction, understands there may be a need for a medical reason and then would be open to one.  -High BMI: had growth scan at 28 wks, would like to put off next scan until 34 weeks at 32 weeks "feels too soon". 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 Vag. Bleeding: None.  Movement: Present. Leaking Fluid denies.  ----------------------------------------------------------------------------------- The following portions of the patient's history were reviewed and updated as appropriate: allergies, current medications, past family history, past medical history, past social history, past surgical history and problem list. Problem list updated.  Objective  Blood pressure 105/73, pulse 90, weight 268 lb (121.6 kg), last menstrual period 12/01/2021. Pregravid weight 260 lb (117.9 kg) Total Weight Gain 8 lb (3.629 kg) Urinalysis: Urine Protein    Urine Glucose    Fetal Status: Fetal Heart Rate (bpm): 140 Fundal Height: 34 cm Movement: Present     General:  Alert, oriented and cooperative. Patient is in no acute distress.  Skin: Skin is warm and dry. No rash noted.   Cardiovascular: Normal heart rate noted  Respiratory: Normal respiratory effort, no problems with respiration noted  Abdomen: Soft, gravid, appropriate for gestational age. Pain/Pressure: Present      Pelvic:  Cervical exam deferred        Extremities: Normal range of motion.  Edema: Mild pitting, slight indentation  Mental Status: Normal mood and affect. Normal behavior. Normal judgment and thought content.   Assessment   27 y.o. G1P0000 at [redacted]w[redacted]d by  09/07/2022, by Last Menstrual Period presenting for routine prenatal visit  Plan   first Problems (from 01/23/22 to present)     Problem Noted Resolved   Supervision of other normal pregnancy, antepartum 01/23/2022 by Loran Senters, CMA No   Overview Addendum 06/23/2022  4:00 PM by Mirna Mires, CNM     Clinical Staff Provider  Office Location  [facility] Dating  L and 9wk [place]   Language  English Anatomy US  normal  Flu Vaccine  offer Genetic Screen  NIPS: negative, xy  TDaP vaccine   offer Hgb A1C or  GTT Early : Third trimester : 130  Covid UTD    LAB RESULTS   Rhogam  06/20/2022  Blood Type A/Negative/-- (01/25 1005)   Feeding Plan breast Antibody Negative (01/25 1005)  Contraception undecided Rubella 1.63 (01/25 1005)  Circumcision yes RPR Non Reactive (01/25 1005)   Pediatrician  undecided HBsAg Negative (01/25 1005)   Support Person Molli Hazard HIV Non Reactive (01/25 1005)  Prenatal Classes yes Varicella Immune    GBS  (For PCN allergy, check sensitivities)   BTL Consent  Hep C Non Reactive (01/25 1005)   VBAC Consent  Pap Diagnosis  Date Value Ref Range Status  07/10/2021   Final   - Negative for intraepithelial lesion or malignancy (NILM)      Hgb Electro      CF      SMA  Preterm labor symptoms and general obstetric precautions including but not limited to vaginal bleeding, contractions, leaking of fluid and fetal movement were reviewed in detail with the patient. Please refer to After Visit Summary for other counseling recommendations.   Return in about 2 weeks (around 07/18/2022) for ROB.  Growth [place] ordered  Carie Caddy, Ina Homes  [facility]  07/05/22  1:09 PM

## 2022-07-18 DIAGNOSIS — O26899 Other specified pregnancy related conditions, unspecified trimester: Secondary | ICD-10-CM | POA: Insufficient documentation

## 2022-07-18 DIAGNOSIS — O99213 Obesity complicating pregnancy, third trimester: Secondary | ICD-10-CM

## 2022-07-18 DIAGNOSIS — O99210 Obesity complicating pregnancy, unspecified trimester: Secondary | ICD-10-CM | POA: Insufficient documentation

## 2022-07-18 HISTORY — DX: Obesity complicating pregnancy, third trimester: O99.213

## 2022-07-21 ENCOUNTER — Ambulatory Visit (INDEPENDENT_AMBULATORY_CARE_PROVIDER_SITE_OTHER): Payer: BLUE CROSS/BLUE SHIELD

## 2022-07-21 VITALS — BP 113/74 | HR 106 | Wt 272.9 lb

## 2022-07-21 DIAGNOSIS — E669 Obesity, unspecified: Secondary | ICD-10-CM

## 2022-07-21 DIAGNOSIS — O99210 Obesity complicating pregnancy, unspecified trimester: Secondary | ICD-10-CM

## 2022-07-21 DIAGNOSIS — O26899 Other specified pregnancy related conditions, unspecified trimester: Secondary | ICD-10-CM

## 2022-07-21 DIAGNOSIS — O99213 Obesity complicating pregnancy, third trimester: Secondary | ICD-10-CM

## 2022-07-21 DIAGNOSIS — Z3A33 33 weeks gestation of pregnancy: Secondary | ICD-10-CM

## 2022-07-21 DIAGNOSIS — Z3480 Encounter for supervision of other normal pregnancy, unspecified trimester: Secondary | ICD-10-CM

## 2022-07-21 DIAGNOSIS — Z6791 Unspecified blood type, Rh negative: Secondary | ICD-10-CM

## 2022-07-21 LAB — POCT URINALYSIS DIPSTICK
Bilirubin, UA: NEGATIVE
Blood, UA: NEGATIVE
Glucose, UA: NEGATIVE
Ketones, UA: NEGATIVE
Leukocytes, UA: NEGATIVE
Nitrite, UA: NEGATIVE
Protein, UA: NEGATIVE
Spec Grav, UA: 1.025 (ref 1.010–1.025)
Urobilinogen, UA: 0.2 E.U./dL
pH, UA: 7 (ref 5.0–8.0)

## 2022-07-21 NOTE — Assessment & Plan Note (Signed)
Growth ultrasound scheduled for 6/27. Reviewed need for weekly NSTs startingat 36 weeks and recommendation for delivery by due date. She strongly desires not to be induced prior to her due date but is open to induction if needed for medical reasons.

## 2022-07-21 NOTE — Progress Notes (Signed)
    Return Prenatal Note   Assessment/Plan   Plan  27 y.o. G1P0000 at [redacted]w[redacted]d presents for follow-up OB visit. Reviewed prenatal record including previous visit note.  Obesity in pregnancy Growth ultrasound scheduled for 6/27. Reviewed need for weekly NSTs startingat 36 weeks and recommendation for delivery by due date. She strongly desires not to be induced prior to her due date but is open to induction if needed for medical reasons.   Supervision of other normal pregnancy, antepartum Reviewed birth plan. She will have a doula and would like to have an epidural. She is hoping to get to 6-8cm prior to epidural but is flexible.  We also discussed normal fetal movements and how to do kick counts in the third trimester. We reviewed comfort measures for round ligament pain and pelvic pressure.  Reviewed kick counts and preterm labor warning signs. Instructed to call office or come to hospital with persistent headache, vision changes, regular contractions, leaking of fluid, decreased fetal movement or vaginal bleeding.    No orders of the defined types were placed in this encounter.  Return in about 2 weeks (around 08/04/2022) for ROB.   Future Appointments  Date Time Provider Department Center  07/31/2022  9:15 AM AOB-AOB [place] 1 AOB-IMG None  07/31/2022 10:15 AM Swanson, Adan Sis, CNM AOB-AOB None    For next visit:  continue with routine prenatal care     Subjective   27 y.o. G1P0000 at [redacted]w[redacted]d presents for this follow-up prenatal visit.  Patient has been feeling increased pelvic pressure and round ligament pain. She also has questions about normal fetal movement and if its ok for the baby to be very active some times and less active other days.  Patient reports: Movement: Present Contractions: Irritability  Objective   Flow sheet Vitals: Pulse Rate: (!) 106 BP: 113/74 Fundal Height: 34 cm Fetal Heart Rate (bpm): 155 Presentation: Vertex Total weight gain: 12 lb 14.4 oz (5.851  kg)  General Appearance  No acute distress, well appearing, and well nourished Pulmonary   Normal work of breathing Neurologic   Alert and oriented to person, place, and time Psychiatric   Mood and affect within normal limits  Lindalou Hose Estie Sproule, CNM  07/20/2409:22 AM

## 2022-07-21 NOTE — Assessment & Plan Note (Signed)
Reviewed birth plan. She will have a doula and would like to have an epidural. She is hoping to get to 6-8cm prior to epidural but is flexible.  We also discussed normal fetal movements and how to do kick counts in the third trimester. We reviewed comfort measures for round ligament pain and pelvic pressure.  Reviewed kick counts and preterm labor warning signs. Instructed to call office or come to hospital with persistent headache, vision changes, regular contractions, leaking of fluid, decreased fetal movement or vaginal bleeding.

## 2022-07-31 ENCOUNTER — Ambulatory Visit (INDEPENDENT_AMBULATORY_CARE_PROVIDER_SITE_OTHER): Payer: BLUE CROSS/BLUE SHIELD | Admitting: Obstetrics

## 2022-07-31 ENCOUNTER — Ambulatory Visit (INDEPENDENT_AMBULATORY_CARE_PROVIDER_SITE_OTHER): Payer: BLUE CROSS/BLUE SHIELD

## 2022-07-31 ENCOUNTER — Encounter: Payer: Self-pay | Admitting: Obstetrics

## 2022-07-31 VITALS — BP 111/76 | HR 83 | Wt 273.0 lb

## 2022-07-31 DIAGNOSIS — Z3480 Encounter for supervision of other normal pregnancy, unspecified trimester: Secondary | ICD-10-CM

## 2022-07-31 DIAGNOSIS — O99213 Obesity complicating pregnancy, third trimester: Secondary | ICD-10-CM

## 2022-07-31 DIAGNOSIS — Z23 Encounter for immunization: Secondary | ICD-10-CM

## 2022-07-31 DIAGNOSIS — Z3403 Encounter for supervision of normal first pregnancy, third trimester: Secondary | ICD-10-CM | POA: Diagnosis not present

## 2022-07-31 DIAGNOSIS — O99210 Obesity complicating pregnancy, unspecified trimester: Secondary | ICD-10-CM

## 2022-07-31 DIAGNOSIS — E669 Obesity, unspecified: Secondary | ICD-10-CM

## 2022-07-31 DIAGNOSIS — Z3A34 34 weeks gestation of pregnancy: Secondary | ICD-10-CM | POA: Diagnosis not present

## 2022-07-31 DIAGNOSIS — Z3402 Encounter for supervision of normal first pregnancy, second trimester: Secondary | ICD-10-CM

## 2022-07-31 LAB — POCT URINALYSIS DIPSTICK OB
Bilirubin, UA: NEGATIVE
Blood, UA: NEGATIVE
Glucose, UA: NEGATIVE
Ketones, UA: NEGATIVE
Leukocytes, UA: NEGATIVE
Nitrite, UA: NEGATIVE
POC,PROTEIN,UA: NEGATIVE
Spec Grav, UA: 1.02 (ref 1.010–1.025)
Urobilinogen, UA: 0.2 E.U./dL
pH, UA: 6.5 (ref 5.0–8.0)

## 2022-07-31 NOTE — Assessment & Plan Note (Addendum)
-  Growth scan today: EFW 55.3%ile, AC 54.7%ile, normal AFI -Declines IOL before 40 weeks if no fetal indications -Begin NSTs at 36 weeks

## 2022-07-31 NOTE — Assessment & Plan Note (Signed)
-  Reviewed making a birth -Plans to take a hospital tour -TDaP given today

## 2022-07-31 NOTE — Progress Notes (Signed)
    Return Prenatal Note   Assessment/Plan   Plan  27 y.o. G1P0000 at [redacted]w[redacted]d presents for follow-up OB visit. Reviewed prenatal record including previous visit note.  Obesity in pregnancy -Growth scan today: EFW 55.3%ile, AC 54.7%ile, normal AFI -Declines IOL before 40 weeks if no fetal indications -Begin NSTs at 36 weeks  Supervision of other normal pregnancy, antepartum -Reviewed making a birth -Plans to take a hospital tour -TDaP given today    Orders Placed This Encounter  Procedures   Tdap vaccine greater than or equal to 7yo IM   POC Urinalysis Dipstick OB   Return in about 1 week (around 08/07/2022).   Future Appointments  Date Time Provider Department Center  08/14/2022 10:15 AM Tresea Mall, CNM [facility]OB-A[facility] None  08/21/2022 10:15 AM Doreene Burke, CNM AOB-A[facility] None  08/28/2022 10:15 AM Dominica Severin, CNM AOB[facility] None    For next visit:  continue with routine prenatal care     Subjective   27 y.o. G1P0000 at [redacted]w[redacted]d presents for this follow-up prenatal visit.  Malan is feeling well. She has occasional BH ctx and is feeling good fetal movement. She is watching videos on childbirth and is interested in a hospital tour. She prefers minimal interventions in labor. Patient reports: Movement: Present Contractions: Not present  Objective   Flow sheet Vitals: Pulse Rate: 83 BP: 111/76 Fundal Height: 36 cm Fetal Heart Rate (bpm): 153 Total weight gain: 13 lb (5.897 kg)  General Appearance  No acute distress, well appearing, and well nourished Pulmonary   Normal work of breathing Neurologic   Alert and oriented to person, place, and time Psychiatric   Mood and affect within normal limits  Glenetta Borg, CNM  06/27/242:31 PM

## 2022-08-14 ENCOUNTER — Other Ambulatory Visit (HOSPITAL_COMMUNITY)
Admission: RE | Admit: 2022-08-14 | Discharge: 2022-08-14 | Disposition: A | Discharge status: Home / Self Care | Payer: No Typology Code available for payment source | Source: Ambulatory Visit | Attending: Obstetrics | Admitting: Obstetrics

## 2022-08-14 ENCOUNTER — Ambulatory Visit (INDEPENDENT_AMBULATORY_CARE_PROVIDER_SITE_OTHER): Payer: No Typology Code available for payment source | Admitting: Obstetrics

## 2022-08-14 VITALS — BP 111/78 | HR 96 | Wt 277.0 lb

## 2022-08-14 DIAGNOSIS — Z3A36 36 weeks gestation of pregnancy: Secondary | ICD-10-CM | POA: Insufficient documentation

## 2022-08-14 DIAGNOSIS — Z3685 Encounter for antenatal screening for Streptococcus B: Secondary | ICD-10-CM | POA: Insufficient documentation

## 2022-08-14 DIAGNOSIS — Z3480 Encounter for supervision of other normal pregnancy, unspecified trimester: Secondary | ICD-10-CM

## 2022-08-14 DIAGNOSIS — Z113 Encounter for screening for infections with a predominantly sexual mode of transmission: Secondary | ICD-10-CM

## 2022-08-14 DIAGNOSIS — E669 Obesity, unspecified: Secondary | ICD-10-CM

## 2022-08-14 DIAGNOSIS — O99213 Obesity complicating pregnancy, third trimester: Secondary | ICD-10-CM

## 2022-08-14 DIAGNOSIS — O99210 Obesity complicating pregnancy, unspecified trimester: Secondary | ICD-10-CM

## 2022-08-14 LAB — POCT URINALYSIS DIPSTICK OB
Bilirubin, UA: NEGATIVE
Blood, UA: NEGATIVE
Glucose, UA: NEGATIVE
Ketones, UA: NEGATIVE
Leukocytes, UA: NEGATIVE
Nitrite, UA: NEGATIVE
POC,PROTEIN,UA: NEGATIVE
Spec Grav, UA: 1.025 (ref 1.010–1.025)
Urobilinogen, UA: 0.2 E.U./dL
pH, UA: 5 (ref 5.0–8.0)

## 2022-08-14 NOTE — Progress Notes (Signed)
    Return Prenatal Note   Assessment/Plan   Plan  27 y.o. G1P0000 at [redacted]w[redacted]d presents for follow-up OB visit. Reviewed prenatal record including previous visit note.  Supervision of other normal pregnancy, antepartum -GBS and GC/chlamydia swabs collected today -Encouraged rest, adequate nutrition, and gentle activity -Reviewed home methods for cervical ripening -Prefers to see providers she has already met until the end of pregnancy  Obesity in pregnancy -Weekly NSTs    Orders Placed This Encounter  Procedures   Strep Gp B NAA   POC Urinalysis Dipstick OB   Return in about 1 week (around 08/21/2022).   Future Appointments  Date Time Provider Department Center  08/15/2022 10:15 AM AOB-NST ROOM AOB-AOB None  08/21/2022  9:15 AM AOB-NST ROOM AOB-AOB None  08/21/2022 10:15 AM Doreene Burke, CNM AOB-AOB None  08/28/2022  3:15 PM AOB-NST ROOM AOB-AOB None  08/28/2022  3:35 PM Glenetta Borg, CNM AOB-AOB None    For next visit:   ROB/NST     Subjective   26 y.o. G1P0000 at [redacted]w[redacted]d presents for this follow-up prenatal visit.  Zanya is feeling fatigued. She is still able to complete her daily tasks, but often feels run down the next day. She is off work for the summer [employer]). Sleep has not been great d/t contractions at night. NST was not scheduled, and she is not able to stay today. She will schedule one for tomorrow. Patient reports: Movement: Present Contractions: Irritability  Objective   Flow sheet Vitals: Pulse Rate: 96 BP: 111/78 Fundal Height: 40 cm Fetal Heart Rate (bpm): 145 Total weight gain: 17 lb (7.711 kg)  General Appearance  No acute distress, well appearing, and well nourished Pulmonary   Normal work of breathing Neurologic   Alert and oriented to person, place, and time Psychiatric   Mood and affect within normal limits  Glenetta Borg, CNM  08/14/22 11:40 AM

## 2022-08-14 NOTE — Assessment & Plan Note (Signed)
-   Weekly NSTs

## 2022-08-14 NOTE — Assessment & Plan Note (Addendum)
-  GBS and GC/chlamydia swabs collected today -Encouraged rest, adequate nutrition, and gentle activity -Reviewed home methods for cervical ripening -Prefers to see providers she has already met until the end of pregnancy -S>D today. Growth scan 07/31/22 WNL.

## 2022-08-14 NOTE — Progress Notes (Signed)
    NURSE VISIT NOTE  Subjective:    Patient ID: Katherine Whitaker, female    DOB: 1995-04-21, 27 y.o.   MRN: [id number]  HPI  Patient is a 27 y.o. G73P0000 female who presents for fetal monitoring per order from Guadlupe Spanish, CNM.   Objective:    BP 114/77   Pulse 90   Ht 5\' 3"  (1.6 m)   Wt 275 lb 12.8 oz (125.1 kg)   LMP 12/01/2021 (Exact Date)   BMI 48.86 kg/m  Estimated Date of Delivery: 09/07/22  [redacted]w[redacted]d  Fetus A Non-Stress Test Interpretation for 08/15/22  Indication: Obesity  Fetal Heart Rate A Mode: External Baseline Rate (A): 145 bpm Variability: Moderate Accelerations: 15 x 15 Decelerations: None Multiple birth?: No  Uterine Activity Mode: Toco Contraction Frequency (min): None  Interpretation (Fetal Testing) Nonstress Test Interpretation: Reactive Overall Impression: Reassuring for gestational age   Assessment:   1. Obesity affecting pregnancy in third trimester, unspecified obesity type   2. [redacted] weeks gestation of pregnancy      Plan:   Results reviewed and discussed with patient by  Paula Compton, CNM.     Rocco Serene, LPN

## 2022-08-14 NOTE — Patient Instructions (Signed)

## 2022-08-15 ENCOUNTER — Ambulatory Visit (INDEPENDENT_AMBULATORY_CARE_PROVIDER_SITE_OTHER): Payer: No Typology Code available for payment source

## 2022-08-15 VITALS — BP 114/77 | HR 90 | Ht 63.0 in | Wt 275.8 lb

## 2022-08-15 DIAGNOSIS — O99213 Obesity complicating pregnancy, third trimester: Secondary | ICD-10-CM

## 2022-08-15 DIAGNOSIS — E669 Obesity, unspecified: Secondary | ICD-10-CM | POA: Diagnosis not present

## 2022-08-15 DIAGNOSIS — Z3A36 36 weeks gestation of pregnancy: Secondary | ICD-10-CM | POA: Diagnosis not present

## 2022-08-16 LAB — STREP GP B NAA: Strep Gp B NAA: NEGATIVE

## 2022-08-18 LAB — CERVICOVAGINAL ANCILLARY ONLY
Chlamydia: NEGATIVE
Comment: NEGATIVE
Comment: NORMAL
Neisseria Gonorrhea: NEGATIVE

## 2022-08-19 ENCOUNTER — Encounter: Payer: Self-pay | Admitting: Obstetrics

## 2022-08-21 ENCOUNTER — Ambulatory Visit (INDEPENDENT_AMBULATORY_CARE_PROVIDER_SITE_OTHER): Payer: No Typology Code available for payment source

## 2022-08-21 ENCOUNTER — Encounter: Payer: BLUE CROSS/BLUE SHIELD | Admitting: Certified Nurse Midwife

## 2022-08-21 ENCOUNTER — Ambulatory Visit (INDEPENDENT_AMBULATORY_CARE_PROVIDER_SITE_OTHER): Payer: No Typology Code available for payment source | Admitting: Certified Nurse Midwife

## 2022-08-21 VITALS — BP 112/79 | HR 84 | Ht 63.0 in | Wt 277.9 lb

## 2022-08-21 VITALS — BP 112/79 | HR 84 | Wt 277.9 lb

## 2022-08-21 DIAGNOSIS — O99213 Obesity complicating pregnancy, third trimester: Secondary | ICD-10-CM | POA: Diagnosis not present

## 2022-08-21 DIAGNOSIS — Z3A37 37 weeks gestation of pregnancy: Secondary | ICD-10-CM | POA: Insufficient documentation

## 2022-08-21 DIAGNOSIS — E669 Obesity, unspecified: Secondary | ICD-10-CM

## 2022-08-21 DIAGNOSIS — Z3A38 38 weeks gestation of pregnancy: Secondary | ICD-10-CM

## 2022-08-21 DIAGNOSIS — O99210 Obesity complicating pregnancy, unspecified trimester: Secondary | ICD-10-CM

## 2022-08-21 LAB — POCT URINALYSIS DIPSTICK OB
Bilirubin, UA: NEGATIVE
Blood, UA: NEGATIVE
Glucose, UA: NEGATIVE
Ketones, UA: NEGATIVE
Leukocytes, UA: NEGATIVE
Nitrite, UA: NEGATIVE
POC,PROTEIN,UA: NEGATIVE
Spec Grav, UA: 1.02 (ref 1.010–1.025)
Urobilinogen, UA: 0.2 E.U./dL
pH, UA: 5 (ref 5.0–8.0)

## 2022-08-21 NOTE — Progress Notes (Unsigned)
    NURSE VISIT NOTE  Subjective:    Patient ID: CALY PELLUM, female    DOB: Feb 28, 1995, 28 y.o.   MRN: [id number]  HPI  Patient is a 27 y.o. G50P0000 female who presents for fetal monitoring per order from Paula Compton, CNM.   Objective:    BP 112/79   Pulse 84   Wt 277 lb 14.4 oz (126.1 kg)   LMP 12/01/2021 (Exact Date)   BMI 49.23 kg/m  Estimated Date of Delivery: 09/07/22  [redacted]w[redacted]d  Fetus A Non-Stress Test Interpretation for 08/21/22  Indication: Obesity  Fetal Heart Rate A Mode: External Baseline Rate (A): 140 bpm Variability: Moderate Accelerations: 15 x 15 Decelerations: None Multiple birth?: No  Uterine Activity Mode: Toco Contraction Frequency (min): None  Interpretation (Fetal Testing) Nonstress Test Interpretation: Reactive Overall Impression: Reassuring for gestational age   Assessment:   1. Obesity affecting pregnancy in third trimester, unspecified obesity type   2. [redacted] weeks gestation of pregnancy      Plan:   Results reviewed and discussed with patient by  Doreene Burke, CNM.     Rocco Serene, LPN

## 2022-08-21 NOTE — Progress Notes (Addendum)
ROB and NST today for obesity.   NST reactive see nurse note  Discussed growth u/s recommendations , she is declining due to cost. She will continue with NST in office.  She verbalizes and agrees. She is feeling good movement and denies any concerns this visit .   Follow up 1 wk for ROB, out patient for u/s and BPP  Doreene Burke, CNM

## 2022-08-21 NOTE — Patient Instructions (Signed)

## 2022-08-22 ENCOUNTER — Encounter: Payer: Self-pay | Admitting: Certified Nurse Midwife

## 2022-08-27 DIAGNOSIS — Z3A38 38 weeks gestation of pregnancy: Secondary | ICD-10-CM | POA: Insufficient documentation

## 2022-08-27 NOTE — Patient Instructions (Signed)

## 2022-08-27 NOTE — Progress Notes (Unsigned)
    NURSE VISIT NOTE  Subjective:    Patient ID: Katherine Whitaker, female    DOB: 22-Jun-1995, 27 y.o.   MRN: [id number]  HPI  Patient is a 27 y.o. G55P0000 female who presents for fetal monitoring per order from Doreene Burke, CNM.   Objective:    LMP 12/01/2021 (Exact Date)  Estimated Date of Delivery: 09/07/22  [redacted]w[redacted]d  Fetus A Non-Stress Test Interpretation for 08/27/22  Indication: Obesity            Assessment:   1. Obesity affecting pregnancy in third trimester, unspecified obesity type   2. [redacted] weeks gestation of pregnancy      Plan:   Results reviewed and discussed with patient by  Guadlupe Spanish, CNM.     Rocco Serene, LPN

## 2022-08-28 ENCOUNTER — Encounter: Payer: BLUE CROSS/BLUE SHIELD | Admitting: Obstetrics

## 2022-08-28 ENCOUNTER — Encounter: Payer: Self-pay | Admitting: Obstetrics

## 2022-08-28 ENCOUNTER — Ambulatory Visit (INDEPENDENT_AMBULATORY_CARE_PROVIDER_SITE_OTHER): Payer: No Typology Code available for payment source

## 2022-08-28 ENCOUNTER — Encounter: Payer: No Typology Code available for payment source | Admitting: Advanced Practice Midwife

## 2022-08-28 ENCOUNTER — Encounter: Payer: BLUE CROSS/BLUE SHIELD | Admitting: Certified Nurse Midwife

## 2022-08-28 ENCOUNTER — Ambulatory Visit (INDEPENDENT_AMBULATORY_CARE_PROVIDER_SITE_OTHER): Payer: No Typology Code available for payment source | Admitting: Obstetrics

## 2022-08-28 ENCOUNTER — Ambulatory Visit: Payer: No Typology Code available for payment source

## 2022-08-28 ENCOUNTER — Other Ambulatory Visit: Payer: No Typology Code available for payment source

## 2022-08-28 VITALS — BP 125/86 | HR 90 | Ht 63.0 in | Wt 281.0 lb

## 2022-08-28 DIAGNOSIS — Z3A38 38 weeks gestation of pregnancy: Secondary | ICD-10-CM

## 2022-08-28 DIAGNOSIS — Z3403 Encounter for supervision of normal first pregnancy, third trimester: Secondary | ICD-10-CM

## 2022-08-28 DIAGNOSIS — O99213 Obesity complicating pregnancy, third trimester: Secondary | ICD-10-CM

## 2022-08-28 DIAGNOSIS — E669 Obesity, unspecified: Secondary | ICD-10-CM | POA: Diagnosis not present

## 2022-08-28 DIAGNOSIS — Z3480 Encounter for supervision of other normal pregnancy, unspecified trimester: Secondary | ICD-10-CM

## 2022-08-28 LAB — POCT URINALYSIS DIPSTICK OB
Appearance: NEGATIVE
Bilirubin, UA: NEGATIVE
Blood, UA: NEGATIVE
Glucose, UA: NEGATIVE
Ketones, UA: NEGATIVE
Leukocytes, UA: NEGATIVE
Nitrite, UA: NEGATIVE
POC,PROTEIN,UA: NEGATIVE
Spec Grav, UA: 1.02 (ref 1.010–1.025)
Urobilinogen, UA: 0.2 E.U./dL
pH, UA: 5 (ref 5.0–8.0)

## 2022-08-28 NOTE — Assessment & Plan Note (Addendum)
-  Discussed encouraging labor, managing discomforts of pregnancy -Discussed induction methods, hospital routines, sequential use of cervical ripening methods. Discussed the likelihood of needing Pitocin for induction/augmentation of labor if cytotec, Foley balloon, AROM, etc do not put her into active labor. -Plan membrane sweep at next visit if no spontaneous labor -Membranes swept today

## 2022-08-28 NOTE — Assessment & Plan Note (Addendum)
-  RNST today. Continue weekly NSTs. Declines repeat growth [place]. -IOL scheduled for 09/07/22 at midnight.

## 2022-08-28 NOTE — Progress Notes (Signed)
    Return Prenatal Note   Assessment/Plan   Plan  27 y.o. G1P0000 at [redacted]w[redacted]d presents for follow-up OB visit. Reviewed prenatal record including previous visit note.  Obesity affecting pregnancy in third trimester -RNST today. Continue weekly NSTs. Declines repeat growth [place]. -IOL scheduled for 09/07/22 at midnight.  Supervision of other normal pregnancy, antepartum -Discussed encouraging labor, managing discomforts of pregnancy -Discussed induction methods, hospital routines, sequential use of cervical ripening methods. Discussed the likelihood of needing Pitocin for induction/augmentation of labor if cytotec, Foley balloon, AROM, etc do not put her into active labor. -Plan membrane sweep at next visit if no spontaneous labor -Membranes swept today    Orders Placed This Encounter  Procedures   POC Urinalysis Dipstick OB   No follow-ups on file.   Future Appointments  Date Time Provider Department Center  09/04/2022  9:15 AM AOB-NST ROOM AOB-AOB None  09/04/2022  9:55 AM Free, Lindalou Hose, CNM AOB-AOB None    For next visit:   ROB/NST/membrane sweep     Subjective   26 y.o. G1P0000 at [redacted]w[redacted]d presents for this follow-up prenatal visit.  Muna is very uncomfortable and is over being pregnant. She is having significant hip and back pain. She still prefers to await spontaneous labor for now but would like to be induced at 40 weeks if it does not occur. She is concerned about the use of Pitocin. She would like  a membrane sweep today.  Movement: Present Contractions: Irritability  Objective   Flow sheet Vitals: Pulse Rate: 90 BP: 125/86 Fetal Heart Rate (bpm): See NST Dilation: 1 Effacement (%): Thick Station: -2 Total weight gain: 21 lb (9.526 kg)  General Appearance  No acute distress, well appearing, and well nourished Pulmonary   Normal work of breathing Neurologic   Alert and oriented to person, place, and time Psychiatric   Mood and affect within normal  limits  Guadlupe Spanish, CNM 08/28/22 4:07 PM

## 2022-08-29 ENCOUNTER — Telehealth: Payer: Self-pay

## 2022-08-29 DIAGNOSIS — O99213 Obesity complicating pregnancy, third trimester: Secondary | ICD-10-CM

## 2022-08-29 NOTE — Telephone Encounter (Signed)
Patient was seen in office by you on 08/28/22 and states that induction was scheduled at visit for 09/07/22 patient states that she has changed her mind and does not want to be induced on date and states that she wants to go into labor naturally and is requesting that you reschedule her induction for 8/7 or 09/11/22. Please review chart and advise. KW

## 2022-08-29 NOTE — Telephone Encounter (Signed)
I have rescheduled the IOL for 09/11/22 at midnight. Since she will be 40 weeks on 8/4, I would like her to have an AFI or BPP and NST sometime between 8/5-8/7.  Thank you!  Missy

## 2022-09-01 ENCOUNTER — Encounter: Payer: Self-pay | Admitting: Obstetrics

## 2022-09-01 NOTE — Telephone Encounter (Signed)
I contacted the patient via phone. I left voicemail with appointment location/ date /time for the patient. She was requesting Tuesday, 8/6 or Wednesday, 8/7. I advised the patient to log into my chart to confirm appointment.

## 2022-09-01 NOTE — Addendum Note (Signed)
Addended by: Kathlene Cote on: 09/01/2022 11:48 AM   Modules accepted: Orders

## 2022-09-03 DIAGNOSIS — Z3A39 39 weeks gestation of pregnancy: Secondary | ICD-10-CM | POA: Insufficient documentation

## 2022-09-03 NOTE — Patient Instructions (Signed)

## 2022-09-03 NOTE — Progress Notes (Unsigned)
    NURSE VISIT NOTE  Subjective:    Patient ID: RHEANA KLEISER, female    DOB: November 03, 1995, 27 y.o.   MRN: [id number]  HPI  Patient is a 27 y.o. G94P0000 female who presents for fetal monitoring per order from Guadlupe Spanish, CNM.   Objective:    LMP 12/01/2021 (Exact Date)  Estimated Date of Delivery: 09/07/22  [redacted]w[redacted]d  Fetus A Non-Stress Test Interpretation for 09/03/22  Indication: Obesity            Assessment:   1. Obesity affecting pregnancy in third trimester, unspecified obesity type   2. [redacted] weeks gestation of pregnancy      Plan:   Results reviewed and discussed with patient by  Autumn Messing, CNM.     Rocco Serene, LPN

## 2022-09-04 ENCOUNTER — Ambulatory Visit (INDEPENDENT_AMBULATORY_CARE_PROVIDER_SITE_OTHER): Payer: No Typology Code available for payment source

## 2022-09-04 VITALS — BP 122/73 | HR 85 | Ht 63.0 in | Wt 283.0 lb

## 2022-09-04 VITALS — BP 122/73 | HR 85 | Wt 283.0 lb

## 2022-09-04 DIAGNOSIS — Z3A39 39 weeks gestation of pregnancy: Secondary | ICD-10-CM | POA: Diagnosis not present

## 2022-09-04 DIAGNOSIS — Z3480 Encounter for supervision of other normal pregnancy, unspecified trimester: Secondary | ICD-10-CM

## 2022-09-04 DIAGNOSIS — O99213 Obesity complicating pregnancy, third trimester: Secondary | ICD-10-CM | POA: Diagnosis not present

## 2022-09-04 DIAGNOSIS — E669 Obesity, unspecified: Secondary | ICD-10-CM

## 2022-09-04 LAB — POCT URINALYSIS DIPSTICK
Bilirubin, UA: NEGATIVE
Blood, UA: NEGATIVE
Glucose, UA: NEGATIVE
Ketones, UA: NEGATIVE
Leukocytes, UA: NEGATIVE
Nitrite, UA: NEGATIVE
Protein, UA: NEGATIVE
Spec Grav, UA: 1.02 (ref 1.010–1.025)
Urobilinogen, UA: 0.2 E.U./dL
pH, UA: 6.5 (ref 5.0–8.0)

## 2022-09-04 NOTE — Assessment & Plan Note (Addendum)
Reactive NST today in clinic. Has BPP with NST on 8/6 and induction scheduled for 8/8. Cervical exam with membrane sweep today, 1/th/high. Reviewed labor warning signs and expectations for birth. Instructed to call office or come to hospital with persistent headache, vision changes, regular contractions, leaking of fluid, decreased fetal movement or vaginal bleeding.

## 2022-09-04 NOTE — Progress Notes (Signed)
    Return Prenatal Note   Assessment/Plan   Plan  27 y.o. G1P0000 at [redacted]w[redacted]d presents for follow-up OB visit. Reviewed prenatal record including previous visit note.  Supervision of other normal pregnancy, antepartum Reactive NST today in clinic. Has BPP with NST on 8/6 and induction scheduled for 8/8. Cervical exam with membrane sweep today, 1/th/high. Reviewed labor warning signs and expectations for birth. Instructed to call office or come to hospital with persistent headache, vision changes, regular contractions, leaking of fluid, decreased fetal movement or vaginal bleeding.   Orders Placed This Encounter  Procedures   POCT Urinalysis Dipstick   No follow-ups on file.   Future Appointments  Date Time Provider Department Center  09/09/2022  9:00 AM [facility]    For next visit:  BPP with NST on 8/6     Subjective   26 y.o. G1P0000 at [redacted]w[redacted]d presents for this follow-up prenatal visit.  Patient is ready for baby. No concerns. Patient reports: Movement: Present Contractions: Irritability  Objective   Flow sheet Vitals: Pulse Rate: 85 BP: 122/73 Fundal Height: 39 cm Fetal Heart Rate (bpm): RNST Dilation: 1 Effacement (%): Thick Station: -3 Total weight gain: 23 lb (10.4 kg)  General Appearance  No acute distress, well appearing, and well nourished Pulmonary   Normal work of breathing Neurologic   Alert and oriented to person, place, and time Psychiatric   Mood and affect within normal limits  Katherine Whitaker, Katherine Whitaker  09/04/2410:04 PM

## 2022-09-07 ENCOUNTER — Inpatient Hospital Stay: Admit: 2022-09-07 | Payer: No Typology Code available for payment source

## 2022-09-08 ENCOUNTER — Ambulatory Visit: Payer: No Typology Code available for payment source

## 2022-09-09 ENCOUNTER — Ambulatory Visit
Admission: RE | Admit: 2022-09-09 | Discharge: 2022-09-09 | Disposition: A | Discharge status: Home / Self Care | Payer: No Typology Code available for payment source | Source: Ambulatory Visit | Attending: Obstetrics | Admitting: Obstetrics

## 2022-09-09 ENCOUNTER — Observation Stay (HOSPITAL_BASED_OUTPATIENT_CLINIC_OR_DEPARTMENT_OTHER)
Admission: EM | Admit: 2022-09-09 | Discharge: 2022-09-09 | Disposition: A | Discharge status: Home / Self Care | Payer: No Typology Code available for payment source | Source: Home / Self Care | Admitting: Advanced Practice Midwife

## 2022-09-09 ENCOUNTER — Telehealth: Payer: Self-pay

## 2022-09-09 ENCOUNTER — Other Ambulatory Visit: Payer: Self-pay

## 2022-09-09 ENCOUNTER — Other Ambulatory Visit: Payer: No Typology Code available for payment source

## 2022-09-09 ENCOUNTER — Encounter: Payer: Self-pay | Admitting: Obstetrics and Gynecology

## 2022-09-09 DIAGNOSIS — O368330 Maternal care for abnormalities of the fetal heart rate or rhythm, third trimester, not applicable or unspecified: Secondary | ICD-10-CM | POA: Insufficient documentation

## 2022-09-09 DIAGNOSIS — Z3A40 40 weeks gestation of pregnancy: Secondary | ICD-10-CM

## 2022-09-09 DIAGNOSIS — O99213 Obesity complicating pregnancy, third trimester: Secondary | ICD-10-CM

## 2022-09-09 DIAGNOSIS — O480 Post-term pregnancy: Secondary | ICD-10-CM | POA: Diagnosis not present

## 2022-09-09 DIAGNOSIS — O26899 Other specified pregnancy related conditions, unspecified trimester: Secondary | ICD-10-CM

## 2022-09-09 DIAGNOSIS — Z3480 Encounter for supervision of other normal pregnancy, unspecified trimester: Principal | ICD-10-CM

## 2022-09-09 HISTORY — DX: 40 weeks gestation of pregnancy: Z3A.40

## 2022-09-09 NOTE — Progress Notes (Signed)
Pt presents to L/D triage from office after BPP 6/8 for monitoring. Pt reports no pain, bleeding, or LOF and positive fetal movement. Pt reports occasional mild contractions. Monitors applied and assessing. Initial FHT 150 with reactive NST. VSS. CNM notified- pt to be discharged.

## 2022-09-09 NOTE — Discharge Summary (Signed)
Physician Final Progress Note  Patient ID: Katherine Whitaker MRN: [id number] DOB/AGE: 1995/03/08 27 y.o.  Admit date: 09/09/2022 Admitting provider: Tresea Mall, CNM Discharge date: 09/09/2022   Admission Diagnoses:  1) intrauterine pregnancy at [redacted]w[redacted]d  2) BPP 6/8  Discharge Diagnoses:  Principal Problem:   Labor and delivery, indication for care Active Problems:   [redacted] weeks gestation of pregnancy  Reactive NST  History of Present Illness: The patient is a 27 y.o. female G1P0000 at [redacted]w[redacted]d who presents from ultrasound for BPP of 6/8- 2 off for breathing. The patient reports good fetal movement. She reports irregular mild contractions. She denies vaginal bleeding or leakage of fluid. NST is reactive in triage. She is scheduled for induction at midnight tomorrow going into Thursday. She is discharged to home with instructions and precautions.    Past Medical History:  Diagnosis Date   PCOS (polycystic ovarian syndrome)     Past Surgical History:  Procedure Laterality Date   KNEE SURGERY  2011    No current facility-administered medications on file prior to encounter.   Current Outpatient Medications on File Prior to Encounter  Medication Sig Dispense Refill   Cyanocobalamin (B-12 PO) Take by mouth.     Prenatal Vit-Fe Fumarate-FA (PRENATAL PO) Take by mouth.     VITAMIN D PO Take by mouth.      No Known Allergies  Social History   Socioeconomic History   Marital status: Married    Spouse name: Molli Hazard   Number of children: 0   Years of education: 18   Highest education level: Not on file  Occupational History   Occupation: Runner, broadcasting/film/video  Tobacco Use   Smoking status: Never   Smokeless tobacco: Never  Vaping Use   Vaping status: Never Used  Substance and Sexual Activity   Alcohol use: Not Currently    Comment: Less than 1 week   Drug use: No   Sexual activity: Yes    Partners: Male    Birth control/protection: None  Other Topics Concern   Not on file  Social  History Narrative   Not on file   Social Determinants of Health   Financial Resource Strain: Low Risk  (01/23/2022)   Overall Financial Resource Strain (CARDIA)    Difficulty of Paying Living Expenses: Not hard at all  Food Insecurity: No Food Insecurity (01/23/2022)   Hunger Vital Sign    Worried About Running Out of Food in the Last Year: Never true    Ran Out of Food in the Last Year: Never true  Transportation Needs: No Transportation Needs (01/23/2022)   PRAPARE - Administrator, Civil Service (Medical): No    Lack of Transportation (Non-Medical): No  Physical Activity: Unknown (01/23/2022)   Exercise Vital Sign    Days of Exercise per Week: 2 days    Minutes of Exercise per Session: Not on file  Stress: No Stress Concern Present (01/23/2022)   [employer] of Occupational Health - Occupational Stress Questionnaire    Feeling of Stress : Not at all  Social Connections: Moderately Integrated (01/23/2022)   Social Connection and Isolation Panel [NHANES]    Frequency of Communication with Friends and Family: More than three times a week    Frequency of Social Gatherings with Friends and Family: Once a week    Attends Religious Services: More than 4 times per year    Active Member of [employer] or Organizations: No    Attends Banker Meetings: Never  Marital Status: Married  Catering manager Violence: Not At Risk (01/23/2022)   Humiliation, Afraid, Rape, and Kick questionnaire    Fear of Current or Ex-Partner: No    Emotionally Abused: No    Physically Abused: No    Sexually Abused: No    Family History  Problem Relation Age of Onset   Healthy Mother    Healthy Father    Seizures Sister    Healthy Brother    Diabetes Maternal Grandmother    Alzheimer's disease Maternal Grandmother    Bipolar disorder Maternal Grandfather    Melanoma Paternal Grandmother    Melanoma Paternal Grandfather    Heart disease Paternal Grandfather    Polycystic  ovary syndrome Other        Paternal First Cousin   Alzheimer's disease Other        MGGM     Review of Systems  Constitutional:  Negative for chills and fever.  HENT:  Negative for congestion, ear discharge, ear pain, hearing loss, sinus pain and sore throat.   Eyes:  Negative for blurred vision and double vision.  Respiratory:  Negative for cough, shortness of breath and wheezing.   Cardiovascular:  Negative for chest pain, palpitations and leg swelling.  Gastrointestinal:  Negative for abdominal pain, blood in stool, constipation, diarrhea, heartburn, melena, nausea and vomiting.  Genitourinary:  Negative for dysuria, flank pain, frequency, hematuria and urgency.  Musculoskeletal:  Negative for back pain, joint pain and myalgias.  Skin:  Negative for itching and rash.  Neurological:  Negative for dizziness, tingling, tremors, sensory change, speech change, focal weakness, seizures, loss of consciousness, weakness and headaches.  Endo/Heme/Allergies:  Negative for environmental allergies. Does not bruise/bleed easily.  Psychiatric/Behavioral:  Negative for depression, hallucinations, memory loss, substance abuse and suicidal ideas. The patient is not nervous/anxious and does not have insomnia.      Physical Exam: BP 128/83 (BP Location: Left Arm)   Pulse 100   Temp 98.5 F (36.9 C) (Oral)   Resp 18   Ht 5' 2.99" (1.6 m)   Wt 127.5 kg   LMP 12/01/2021 (Exact Date)   BMI 49.79 kg/m   Constitutional: obese female in no acute distress.  HEENT: normal Skin: Warm and dry.  Cardiovascular: Regular rate and rhythm.   Extremity:  no edema   Respiratory: Clear to auscultation bilateral. Normal respiratory effort Abdomen: FHT present Psych: Alert and Oriented x3. No memory deficits. Normal mood and affect.   Fetal well being: 140 bpm, moderate variability, +accelerations, -decelerations Toco: irregular, mild to palpation, every 2-6 minutes  Consults: None  Significant Findings/  Diagnostic Studies: ultrasound CLINICAL DATA:  obesity affecting pregnancy in third trimester   EXAM: BIOPHYSICAL PROFILE   FINDINGS: Number of Fetuses: 1   Heart Rate: 133 bpm   Presentation: Cephalic   Movement: 2 time: 30 minutes   Breathing: 0 (slight diaphragmatic movement was visualized although 30 second breathing episodes were not observed over a 30 minute period)   Tone: 2   Amniotic Fluid: 2   Total Score: 6/8   IMPRESSION: Biophysical profile score of 6/8.   These results will be called to the ordering clinician or representative by the Radiologist Assistant, and communication documented in the PACS or [employer].   Electronically Signed   By: Duanne Guess D.O.   On: 09/09/2022 10:20    Procedures: NST  Hospital Course: The patient was admitted to Labor and Delivery Triage for observation.   Discharge Condition: good  Disposition:  Discharge disposition: 01-Home or Self Care  Diet: Regular diet  Discharge Activity: Activity as tolerated  Discharge Instructions     Discharge activity:  No Restrictions   Complete by: As directed    Discharge diet:  No restrictions   Complete by: As directed       Allergies as of 09/09/2022   No Known Allergies      Medication List     TAKE these medications    B-12 PO Take by mouth.   PRENATAL PO Take by mouth.   VITAMIN D PO Take by mouth.         Total time spent taking care of this patient: 22 minutes  Signed: Tresea Mall, CNM  09/09/2022, 12:08 PM

## 2022-09-09 NOTE — Telephone Encounter (Signed)
[name] with ultrasound called to let us know Katherine Whitaker had a BPP done 6/8 and no fetal breathing 30 mins. Spoke with Dr. Valentino Saxon and she advised me to call the patient and have her go to L&D for further observation. Patient understood

## 2022-09-11 ENCOUNTER — Inpatient Hospital Stay: Payer: No Typology Code available for payment source | Admitting: General Practice

## 2022-09-11 ENCOUNTER — Inpatient Hospital Stay
Admission: EM | Admit: 2022-09-11 | Discharge: 2022-09-13 | DRG: 807 | Disposition: A | Discharge status: Home / Self Care | Payer: No Typology Code available for payment source | Attending: Certified Nurse Midwife | Admitting: Certified Nurse Midwife

## 2022-09-11 ENCOUNTER — Other Ambulatory Visit: Payer: Self-pay

## 2022-09-11 ENCOUNTER — Encounter: Payer: Self-pay | Admitting: Obstetrics and Gynecology

## 2022-09-11 DIAGNOSIS — O368330 Maternal care for abnormalities of the fetal heart rate or rhythm, third trimester, not applicable or unspecified: Secondary | ICD-10-CM | POA: Diagnosis present

## 2022-09-11 DIAGNOSIS — Z23 Encounter for immunization: Secondary | ICD-10-CM | POA: Diagnosis not present

## 2022-09-11 DIAGNOSIS — Z8249 Family history of ischemic heart disease and other diseases of the circulatory system: Secondary | ICD-10-CM

## 2022-09-11 DIAGNOSIS — O26899 Other specified pregnancy related conditions, unspecified trimester: Secondary | ICD-10-CM

## 2022-09-11 DIAGNOSIS — O99214 Obesity complicating childbirth: Principal | ICD-10-CM | POA: Diagnosis present

## 2022-09-11 DIAGNOSIS — Z3A40 40 weeks gestation of pregnancy: Secondary | ICD-10-CM | POA: Diagnosis not present

## 2022-09-11 DIAGNOSIS — O99213 Obesity complicating pregnancy, third trimester: Secondary | ICD-10-CM

## 2022-09-11 DIAGNOSIS — O26893 Other specified pregnancy related conditions, third trimester: Secondary | ICD-10-CM | POA: Diagnosis present

## 2022-09-11 DIAGNOSIS — Z6791 Unspecified blood type, Rh negative: Secondary | ICD-10-CM | POA: Diagnosis not present

## 2022-09-11 DIAGNOSIS — E663 Overweight: Secondary | ICD-10-CM | POA: Diagnosis not present

## 2022-09-11 DIAGNOSIS — Z3480 Encounter for supervision of other normal pregnancy, unspecified trimester: Principal | ICD-10-CM

## 2022-09-11 DIAGNOSIS — O480 Post-term pregnancy: Secondary | ICD-10-CM | POA: Diagnosis present

## 2022-09-11 LAB — CBC
HCT: 38.1 % (ref 36.0–46.0)
Hemoglobin: 12.4 g/dL (ref 12.0–15.0)
MCH: 27.4 pg (ref 26.0–34.0)
MCHC: 32.5 g/dL (ref 30.0–36.0)
MCV: 84.1 fL (ref 80.0–100.0)
Platelets: 236 10*3/uL (ref 150–400)
RBC: 4.53 MIL/uL (ref 3.87–5.11)
RDW: 15 % (ref 11.5–15.5)
WBC: 15.3 10*3/uL — ABNORMAL HIGH (ref 4.0–10.5)
nRBC: 0 % (ref 0.0–0.2)

## 2022-09-11 LAB — TYPE AND SCREEN
ABO/RH(D): A NEG
Antibody Screen: NEGATIVE

## 2022-09-11 LAB — RPR: RPR Ser Ql: NONREACTIVE

## 2022-09-11 LAB — ABO/RH: ABO/RH(D): A NEG

## 2022-09-11 MED ORDER — FENTANYL CITRATE (PF) 100 MCG/2ML IJ SOLN: 50.0000 ug | INTRAMUSCULAR | Status: DC | PRN

## 2022-09-11 MED ORDER — OXYTOCIN 10 UNIT/ML IJ SOLN
INTRAMUSCULAR | Status: AC
  Filled 2022-09-11: qty 2

## 2022-09-11 MED ORDER — MISOPROSTOL 200 MCG PO TABS
ORAL_TABLET | ORAL | Status: AC
  Filled 2022-09-11: qty 4

## 2022-09-11 MED ORDER — FENTANYL-BUPIVACAINE-NACL 0.5-0.125-0.9 MG/250ML-% EP SOLN
EPIDURAL | Status: AC
  Filled 2022-09-11: qty 250

## 2022-09-11 MED ORDER — AMMONIA AROMATIC IN INHA
RESPIRATORY_TRACT | Status: AC
  Filled 2022-09-11: qty 10

## 2022-09-11 MED ORDER — TERBUTALINE SULFATE 1 MG/ML IJ SOLN
0.2500 mg | Freq: Once | INTRAMUSCULAR | Status: DC | PRN
  Filled 2022-09-11: qty 1

## 2022-09-11 MED ORDER — HYDROXYZINE HCL 25 MG PO TABS: 50.0000 mg | ORAL_TABLET | Freq: Four times a day (QID) | ORAL | Status: DC | PRN

## 2022-09-11 MED ORDER — CALCIUM CARBONATE ANTACID 500 MG PO CHEW: 2.0000 | CHEWABLE_TABLET | ORAL | Status: DC | PRN

## 2022-09-11 MED ORDER — LIDOCAINE HCL (PF) 1 % IJ SOLN
INTRAMUSCULAR | Status: AC
  Filled 2022-09-11: qty 30

## 2022-09-11 MED ORDER — ZOLPIDEM TARTRATE 5 MG PO TABS: 5.0000 mg | ORAL_TABLET | Freq: Every evening | ORAL | Status: DC | PRN

## 2022-09-11 MED ORDER — ONDANSETRON HCL 4 MG/2ML IJ SOLN
4.0000 mg | Freq: Four times a day (QID) | INTRAMUSCULAR | Status: DC | PRN
  Administered 2022-09-11 (×2): 4 mg via INTRAVENOUS
  Filled 2022-09-11 (×2): qty 2

## 2022-09-11 MED ORDER — ACETAMINOPHEN 325 MG PO TABS
650.0000 mg | ORAL_TABLET | ORAL | Status: DC | PRN
  Filled 2022-09-11: qty 2

## 2022-09-11 MED ORDER — MISOPROSTOL 50MCG HALF TABLET
50.0000 ug | ORAL_TABLET | ORAL | Status: DC | PRN
  Administered 2022-09-11: 50 ug via VAGINAL
  Filled 2022-09-11 (×2): qty 1

## 2022-09-11 MED ORDER — LACTATED RINGERS IV SOLN: 500.0000 mL | INTRAVENOUS | Status: DC | PRN

## 2022-09-11 MED ORDER — OXYTOCIN BOLUS FROM INFUSION: 333.0000 mL | Freq: Once | INTRAVENOUS | Status: DC

## 2022-09-11 MED ORDER — EPHEDRINE 5 MG/ML INJ
INTRAVENOUS | Status: AC
  Filled 2022-09-11: qty 5

## 2022-09-11 MED ORDER — SOD CITRATE-CITRIC ACID 500-334 MG/5ML PO SOLN: 30.0000 mL | ORAL | Status: DC | PRN

## 2022-09-11 MED ORDER — MISOPROSTOL 50MCG HALF TABLET
50.0000 ug | ORAL_TABLET | Freq: Once | ORAL | Status: AC
  Administered 2022-09-11: 50 ug via ORAL
  Filled 2022-09-11: qty 1

## 2022-09-11 MED ORDER — OXYTOCIN-SODIUM CHLORIDE 30-0.9 UT/500ML-% IV SOLN: 2.5000 [IU]/h | INTRAVENOUS | Status: DC

## 2022-09-11 MED ORDER — LIDOCAINE HCL (PF) 1 % IJ SOLN: 30.0000 mL | INTRAMUSCULAR | Status: DC | PRN

## 2022-09-11 MED ORDER — OXYTOCIN-SODIUM CHLORIDE 30-0.9 UT/500ML-% IV SOLN
1.0000 m[IU]/min | INTRAVENOUS | Status: DC
  Administered 2022-09-11: 2 m[IU]/min via INTRAVENOUS

## 2022-09-11 MED ORDER — MISOPROSTOL 25 MCG QUARTER TABLET
25.0000 ug | ORAL_TABLET | Freq: Once | ORAL | Status: AC
  Administered 2022-09-11: 25 ug via VAGINAL
  Filled 2022-09-11: qty 1

## 2022-09-11 MED ORDER — OXYTOCIN-SODIUM CHLORIDE 30-0.9 UT/500ML-% IV SOLN
1.0000 m[IU]/min | INTRAVENOUS | Status: DC
  Filled 2022-09-11: qty 500

## 2022-09-11 MED ORDER — LACTATED RINGERS IV SOLN: INTRAVENOUS | Status: DC

## 2022-09-11 NOTE — Progress Notes (Signed)
LABOR NOTE   Katherine Whitaker 26 y.o.GP@ at [redacted]w[redacted]d  SUBJECTIVE:   Uncomfortable with contractions Analgesia: Labor support without medications  OBJECTIVE:  BP 127/74 (BP Location: Right Arm)   Pulse 64   Temp (!) 97.4 F (36.3 C) (Oral)   Resp 16   Ht 5\' 2"  (1.575 m)   Wt 127.5 kg   LMP 12/01/2021 (Exact Date)   BMI 51.40 kg/m  No intake/output data recorded.  She has shown cervical change. CERVIX: 3 cm:  70%:   -2:   mid position:   soft SVE:   Dilation: 3 Effacement (%): Thick Station: -2 Exam by:: Doreene Burke, CNM CONTRACTIONS: regular, every 2-4 minutes FHR: Fetal heart tracing reviewed. Baseline: 135 bpm, Variability: Good {> 6 bpm), Accelerations: Reactive, and Decelerations: Absent Category I    Labs: Lab Results  Component Value Date   WBC 15.3 (H) 09/11/2022   HGB 12.4 09/11/2022   HCT 38.1 09/11/2022   MCV 84.1 09/11/2022   PLT 236 09/11/2022    ASSESSMENT: 1) Labor curve reviewed.       Progress: Early latent labor.     Membranes: intact     Cooks catheter in place       Principal Problem:   Labor and delivery, indication for care   PLAN: continue present management and second dose of cytotec placed vaginally Dr. Valentino Saxon notified of progress.   Doreene Burke, CNM  09/11/2022 12:49 PM

## 2022-09-11 NOTE — Progress Notes (Signed)
LABOR NOTE   Katherine Whitaker 27 y.o.GP@ at [redacted]w[redacted]d  SUBJECTIVE:  Contractions feeling less intense now than they were earlier  Analgesia: Labor support without medications  OBJECTIVE:  BP 127/74 (BP Location: Right Arm)   Pulse 64   Temp (!) 97.4 F (36.3 C) (Oral)   Resp 16   Ht 5\' 2"  (1.575 m)   Wt 127.5 kg   LMP 12/01/2021 (Exact Date)   BMI 51.40 kg/m  No intake/output data recorded.  She has shown cervical change. CERVIX: 7cm:  80%:   -2:   mid position:   soft SVE:   Dilation: 7 Effacement (%): 80 Station: -2 Exam by:: Doreene Burke, CNM CONTRACTIONS: regular, every 2-3 minutes FHR: Fetal heart tracing reviewed. Baseline: 140 bpm, Variability: Fair (1-6 bpm), Accelerations: Reactive, and Decelerations: Absent Category II    Labs: Lab Results  Component Value Date   WBC 15.3 (H) 09/11/2022   HGB 12.4 09/11/2022   HCT 38.1 09/11/2022   MCV 84.1 09/11/2022   PLT 236 09/11/2022    ASSESSMENT: 1) Labor curve reviewed.       Progress: Active phase labor.     Membranes: intact            Principal Problem:   Labor and delivery, indication for care   PLAN: Discussed next steps with induction. Offered AROM, pt declines prefers to try pitocin rather than AROM at this time. Will start pitocin given pt is feeling that contractions or decreasing in intensity. Dr. Valentino Saxon informed of patient progress.    Doreene Burke, CNM  09/11/2022 5:32 PM

## 2022-09-11 NOTE — Progress Notes (Signed)
LABOR NOTE   Katherine Whitaker 27 y.o.GP@ at [redacted]w[redacted]d  SUBJECTIVE:   Feeling some mild cramping  Analgesia: Labor support without medications  OBJECTIVE:  BP 121/74   Pulse 76   Temp 98.1 F (36.7 C) (Oral)   Resp 16   Ht 5\' 2"  (1.575 m)   Wt 127.5 kg   LMP 12/01/2021 (Exact Date)   BMI 51.40 kg/m  No intake/output data recorded.  She has not shown cervical change. CERVIX: 1 cm:  50%:   -2:   posterior:   soft SVE:   Dilation: 1 Effacement (%): Thick Station: -2 Exam by:: Doreene Burke, CNM CONTRACTIONS: regular, every 1-6 minutes FHR: Fetal heart tracing reviewed. Baseline: 140 bpm, Variability: Good {> 6 bpm), Accelerations: Reactive, and Decelerations: Absent Category I    Labs: Lab Results  Component Value Date   WBC 15.3 (H) 09/11/2022   HGB 12.4 09/11/2022   HCT 38.1 09/11/2022   MCV 84.1 09/11/2022   PLT 236 09/11/2022    ASSESSMENT: 1) Labor curve reviewed.       Progress: Not in labor.     Membranes: intact     Cooks cath placed        Principal Problem:   Labor and delivery, indication for care Obesity in pregnancy  PLAN: continue present management Discussed procedure risk and benefits cooks catheter for labor induction . Pt and her partner verbalizes understanding and consent to placement. Cooks catheter placed without difficulty. Pt tolerated well.   Doreene Burke, CNM  09/11/2022 10:03 AM

## 2022-09-11 NOTE — H&P (Signed)
History and Physical   HPI  Katherine Whitaker is a 27 y.o. G1P0000 at [redacted]w[redacted]d Estimated Date of Delivery: 09/07/22 who is being admitted for induction of labor due to elevated BMI in pregnancy.    OB History  OB History  Gravida Para Term Preterm AB Living  1 0 0 0 0 0  SAB IAB Ectopic Multiple Live Births  0 0 0 0 0    # Outcome Date GA Lbr Len/2nd Weight Sex Type Anes PTL Lv  1 Current             PROBLEM LIST  Pregnancy complications or risks: Patient Active Problem List   Diagnosis Date Noted   Labor and delivery, indication for care 09/09/2022   [redacted] weeks gestation of pregnancy 09/09/2022   Rh negative state in antepartum period 07/18/2022   Obesity affecting pregnancy in third trimester 07/18/2022   Supervision of other normal pregnancy, antepartum 01/23/2022    Prenatal labs and studies: ABO, Rh: --/--/A NEG Performed at [facility], [address] [place], [place] [address]  [phone]) Antibody: NEG (08/08 0511) Rubella: 1.63 (01/25 1005) RPR: Non Reactive (05/17 0935)  HBsAg: Negative (01/25 1005)  HIV: Non Reactive (05/17 0935)  YNW:GNFAOZHY/-- (07/11 1222)   Past Medical History:  Diagnosis Date   PCOS (polycystic ovarian syndrome)      Past Surgical History:  Procedure Laterality Date   KNEE SURGERY  2011     Medications    Current Discharge Medication List     CONTINUE these medications which have NOT CHANGED   Details  Cyanocobalamin (B-12 PO) Take by mouth.    Prenatal Vit-Fe Fumarate-FA (PRENATAL PO) Take by mouth.    VITAMIN D PO Take by mouth.         Allergies  Patient has no known allergies.  Review of Systems  Constitutional: negative Eyes: negative Ears, nose, mouth, throat, and face: negative Respiratory: negative Cardiovascular: negative Gastrointestinal: negative Genitourinary:negative Integument/breast: negative Hematologic/lymphatic: negative Musculoskeletal:negative Neurological:  negative Behavioral/Psych: negative Endocrine: negative Allergic/Immunologic: negative  Physical Exam  BP 121/74   Pulse 76   Temp 98.1 F (36.7 C) (Oral)   Resp 16   Ht 5\' 2"  (1.575 m)   Wt 127.5 kg   LMP 12/01/2021 (Exact Date)   BMI 51.40 kg/m   Lungs:  CTA B Cardio: RRR without M/R/G Abd: Soft, gravid, NT Presentation: cephalic EXT: No C/C/ 1+ Edema DTRs: 2+ B CERVIX:    See Prenatal records for more detailed PE.     FHR:  Baseline: 135 bpm, Variability: Good {> 6 bpm), Accelerations: Reactive, and Decelerations: Absent  Toco: Uterine Contractions: Frequency: Every 3-4 minutes and Intensity: mild  Test Results  Results for orders placed or performed during the hospital encounter of 09/11/22 (from the past 24 hour(s))  CBC     Status: Abnormal   Collection Time: 09/11/22  5:11 AM  Result Value Ref Range   WBC 15.3 (H) 4.0 - 10.5 K/uL   RBC 4.53 3.87 - 5.11 MIL/uL   Hemoglobin 12.4 12.0 - 15.0 g/dL   HCT 86.5 78.4 - 69.6 %   MCV 84.1 80.0 - 100.0 fL   MCH 27.4 26.0 - 34.0 pg   MCHC 32.5 30.0 - 36.0 g/dL   RDW 29.5 28.4 - 13.2 %   Platelets 236 150 - 400 K/uL   nRBC 0.0 0.0 - 0.2 %  Type and screen     Status: None  Collection Time: 09/11/22  5:11 AM  Result Value Ref Range   ABO/RH(D) A NEG    Antibody Screen NEG    Sample Expiration      09/14/2022,2359 Performed at [facility], [address] [place], [place] [address]   ABO/Rh     Status: None   Collection Time: 09/11/22  5:50 AM  Result Value Ref Range   ABO/RH(D)      A NEG Performed at [facility], [address] [place], [place] [address]    Group B Strep negative  Assessment   G1P0000 at [redacted]w[redacted]d Estimated Date of Delivery: 09/07/22  The fetus is reassuring.   Patient Active Problem List   Diagnosis Date Noted   Labor and delivery, indication for care 09/09/2022   [redacted] weeks gestation of pregnancy 09/09/2022   Rh negative state in antepartum period 07/18/2022    Obesity affecting pregnancy in third trimester 07/18/2022   Supervision of other normal pregnancy, antepartum 01/23/2022    Plan  1. Admit to L&D :   2. EFM:-- Category 1 3. Stadol or Epidural if desired.   4. Admission labs completed 5. Dr. Valentino Saxon aware of plan of care.  Doreene Burke, CNM  09/11/2022 8:01 AM

## 2022-09-11 NOTE — Progress Notes (Signed)
LABOR NOTE   Katherine Whitaker 26 y.o.GP@ at [redacted]w[redacted]d  SUBJECTIVE:  Pt called out stating her water broke  Analgesia: Labor support without medications  OBJECTIVE:  BP 127/74 (BP Location: Right Arm)   Pulse 64   Temp 98.7 F (37.1 C) (Oral)   Resp 16   Ht 5\' 2"  (1.575 m)   Wt 127.5 kg   LMP 12/01/2021 (Exact Date)   BMI 51.40 kg/m  No intake/output data recorded.  She has shown cervical change. CERVIX: 8 cm:  80%:   -2:   mid position:   soft SVE:   Dilation: 8 Effacement (%): 70 Station: -2 Exam by:: Doreene Burke CNM CONTRACTIONS: regular, every 2- minutes FHR: Fetal heart tracing reviewed. Baseline: 145 bpm, Variability: Good {> 6 bpm), Accelerations: Non-reactive but appropriate for gestational age, and Decelerations: Absent Category I    Labs: Lab Results  Component Value Date   WBC 15.3 (H) 09/11/2022   HGB 12.4 09/11/2022   HCT 38.1 09/11/2022   MCV 84.1 09/11/2022   PLT 236 09/11/2022    ASSESSMENT: 1) Labor curve reviewed.       Progress: Active phase labor.     Membranes: spontaneous rupture, meconium moderate     Principal Problem:   Labor and delivery, indication for care   PLAN: IV Pitocin induction, continue present management    Doreene Burke, CNM  09/11/2022 9:03 PM

## 2022-09-11 NOTE — Progress Notes (Signed)
LABOR NOTE   Katherine Whitaker 26 y.o.GP@ at [redacted]w[redacted]d  SUBJECTIVE:  Feeling some pressure  Analgesia: Labor support without medications  OBJECTIVE:  BP 116/77 (BP Location: Right Arm)   Pulse 84   Temp 99.6 F (37.6 C) (Oral)   Resp 20   Ht 5\' 2"  (1.575 m)   Wt 127.5 kg   LMP 12/01/2021 (Exact Date)   BMI 51.40 kg/m  No intake/output data recorded.  She has not shown cervical change. CERVIX: 8:  80%:   -2:   mid position:   soft SVE:   Dilation: 8 Effacement (%): 80 Station: -2 Exam by:: Janee Morn CNM CONTRACTIONS: regular, every 2-3 minutes FHR: Fetal heart tracing reviewed. Baseline: 135 bpm, Variability: Good {> 6 bpm), Accelerations: Reactive, and Decelerations: Early Category I    Labs: Lab Results  Component Value Date   WBC 15.3 (H) 09/11/2022   HGB 12.4 09/11/2022   HCT 38.1 09/11/2022   MCV 84.1 09/11/2022   PLT 236 09/11/2022    ASSESSMENT: 1) Labor curve reviewed.       Progress: Active phase labor.     Membranes: ruptured, meconium moderate            Principal Problem:   Labor and delivery, indication for care Obesity   PLAN: continue present management and IV Pitocin induction Pt requesting epidural. Anesthesia notified.   Doreene Burke, CNM  09/11/2022 11:26 PM

## 2022-09-12 ENCOUNTER — Encounter: Payer: Self-pay | Admitting: Obstetrics and Gynecology

## 2022-09-12 DIAGNOSIS — E663 Overweight: Secondary | ICD-10-CM

## 2022-09-12 LAB — CBC
HCT: 33.6 % — ABNORMAL LOW (ref 36.0–46.0)
Hemoglobin: 11 g/dL — ABNORMAL LOW (ref 12.0–15.0)
MCH: 27.8 pg (ref 26.0–34.0)
MCHC: 32.7 g/dL (ref 30.0–36.0)
MCV: 85.1 fL (ref 80.0–100.0)
Platelets: 183 10*3/uL (ref 150–400)
RBC: 3.95 MIL/uL (ref 3.87–5.11)
RDW: 15 % (ref 11.5–15.5)
WBC: 19.1 10*3/uL — ABNORMAL HIGH (ref 4.0–10.5)
nRBC: 0 % (ref 0.0–0.2)

## 2022-09-12 MED ORDER — PHENYLEPHRINE 80 MCG/ML (10ML) SYRINGE FOR IV PUSH (FOR BLOOD PRESSURE SUPPORT): 80.0000 ug | PREFILLED_SYRINGE | INTRAVENOUS | Status: DC | PRN

## 2022-09-12 MED ORDER — WITCH HAZEL-GLYCERIN EX PADS
1.0000 | MEDICATED_PAD | CUTANEOUS | Status: DC | PRN
  Filled 2022-09-12: qty 100

## 2022-09-12 MED ORDER — METHYLERGONOVINE MALEATE 0.2 MG/ML IJ SOLN: 0.2000 mg | INTRAMUSCULAR | Status: DC | PRN

## 2022-09-12 MED ORDER — SENNOSIDES-DOCUSATE SODIUM 8.6-50 MG PO TABS
2.0000 | ORAL_TABLET | ORAL | Status: DC
  Filled 2022-09-12 (×2): qty 2

## 2022-09-12 MED ORDER — COCONUT OIL OIL: 1.0000 | TOPICAL_OIL | Status: DC | PRN

## 2022-09-12 MED ORDER — BENZOCAINE-MENTHOL 20-0.5 % EX AERO
1.0000 | INHALATION_SPRAY | CUTANEOUS | Status: DC | PRN
  Filled 2022-09-12: qty 56

## 2022-09-12 MED ORDER — ACETAMINOPHEN 325 MG PO TABS
650.0000 mg | ORAL_TABLET | ORAL | Status: DC | PRN
  Administered 2022-09-13 (×2): 650 mg via ORAL
  Filled 2022-09-12 (×2): qty 2

## 2022-09-12 MED ORDER — EPHEDRINE 5 MG/ML INJ: 10.0000 mg | INTRAVENOUS | Status: DC | PRN

## 2022-09-12 MED ORDER — METHYLERGONOVINE MALEATE 0.2 MG PO TABS: 0.2000 mg | ORAL_TABLET | ORAL | Status: DC | PRN

## 2022-09-12 MED ORDER — OXYCODONE-ACETAMINOPHEN 5-325 MG PO TABS: 1.0000 | ORAL_TABLET | ORAL | Status: DC | PRN

## 2022-09-12 MED ORDER — IBUPROFEN 600 MG PO TABS
600.0000 mg | ORAL_TABLET | Freq: Four times a day (QID) | ORAL | Status: DC
  Administered 2022-09-12 – 2022-09-13 (×4): 600 mg via ORAL
  Filled 2022-09-12 (×4): qty 1

## 2022-09-12 MED ORDER — FERROUS SULFATE 325 (65 FE) MG PO TABS
325.0000 mg | ORAL_TABLET | Freq: Every day | ORAL | Status: DC
  Administered 2022-09-13: 325 mg via ORAL
  Filled 2022-09-12: qty 1

## 2022-09-12 MED ORDER — LIDOCAINE-EPINEPHRINE (PF) 1.5 %-1:200000 IJ SOLN
INTRAMUSCULAR | Status: DC | PRN
  Administered 2022-09-12: 3 mL via PERINEURAL

## 2022-09-12 MED ORDER — PRENATAL MULTIVITAMIN CH
1.0000 | ORAL_TABLET | Freq: Every day | ORAL | Status: DC
  Filled 2022-09-12 (×2): qty 1

## 2022-09-12 MED ORDER — SIMETHICONE 80 MG PO CHEW: 80.0000 mg | CHEWABLE_TABLET | ORAL | Status: DC | PRN

## 2022-09-12 MED ORDER — LACTATED RINGERS IV SOLN: 500.0000 mL | Freq: Once | INTRAVENOUS | Status: DC

## 2022-09-12 MED ORDER — DIBUCAINE (PERIANAL) 1 % EX OINT
1.0000 | TOPICAL_OINTMENT | CUTANEOUS | Status: DC | PRN
  Filled 2022-09-12: qty 28

## 2022-09-12 MED ORDER — ONDANSETRON HCL 4 MG PO TABS: 4.0000 mg | ORAL_TABLET | ORAL | Status: DC | PRN

## 2022-09-12 MED ORDER — ONDANSETRON HCL 4 MG/2ML IJ SOLN: 4.0000 mg | INTRAMUSCULAR | Status: DC | PRN

## 2022-09-12 MED ORDER — DIPHENHYDRAMINE HCL 50 MG/ML IJ SOLN: 12.5000 mg | INTRAMUSCULAR | Status: DC | PRN

## 2022-09-12 MED ORDER — DOCUSATE SODIUM 100 MG PO CAPS
100.0000 mg | ORAL_CAPSULE | Freq: Two times a day (BID) | ORAL | Status: DC
  Filled 2022-09-12: qty 1

## 2022-09-12 MED ORDER — SODIUM CHLORIDE 0.9 % IV SOLN
INTRAVENOUS | Status: DC | PRN
  Administered 2022-09-12: 8 mL via EPIDURAL

## 2022-09-12 MED ORDER — FENTANYL-BUPIVACAINE-NACL 0.5-0.125-0.9 MG/250ML-% EP SOLN
12.0000 mL/h | EPIDURAL | Status: DC | PRN
  Administered 2022-09-12: 12 mL/h via EPIDURAL

## 2022-09-12 MED ORDER — OXYCODONE-ACETAMINOPHEN 5-325 MG PO TABS: 2.0000 | ORAL_TABLET | ORAL | Status: DC | PRN

## 2022-09-12 NOTE — Anesthesia Procedure Notes (Signed)
Epidural Patient location during procedure: OB Start time: 09/11/2022 11:40 PM End time: 09/12/2022 12:10 AM  Staffing Anesthesiologist: Stephanie Coup, MD Performed: anesthesiologist   Preanesthetic Checklist Completed: patient identified, IV checked, site marked, risks and benefits discussed, surgical consent, monitors and equipment checked, pre-op evaluation and timeout performed  Epidural Patient position: sitting Prep: Betadine Patient monitoring: heart rate, continuous pulse ox and blood pressure Approach: midline Location: L4-L5 Injection technique: LOR saline  Needle:  Needle type: Tuohy  Needle gauge: 18 G Needle length: 9 cm and 9 Needle insertion depth: 9 cm Catheter type: closed end flexible Catheter size: 20 Guage Catheter at skin depth: 15 cm Test dose: negative and 1.5% lidocaine with Epi 1:200 K  Assessment Sensory level: T4 Events: blood not aspirated, no cerebrospinal fluid, injection not painful, no injection resistance, no paresthesia and negative IV test  Additional Notes 2 attempts Pt. Evaluated and documentation done after procedure finished. Patient identified. Risks/Benefits/Options discussed with patient including but not limited to bleeding, infection, nerve damage, paralysis, failed block, incomplete pain control, headache, blood pressure changes, nausea, vomiting, reactions to medication both or allergic, itching and postpartum back pain. Confirmed with bedside nurse the patient's most recent platelet count. Confirmed with patient that they are not currently taking any anticoagulation, have any bleeding history or any family history of bleeding disorders. Patient expressed understanding and wished to proceed. All questions were answered. Sterile technique was used throughout the entire procedure. Please see nursing notes for vital signs. Test dose was given through epidural catheter and negative prior to continuing to dose epidural or start infusion.  Warning signs of high block given to the patient including shortness of breath, tingling/numbness in hands, complete motor block, or any concerning symptoms with instructions to call for help. Patient was given instructions on fall risk and not to get out of bed. All questions and concerns addressed with instructions to call with any issues or inadequate analgesia.    Patient tolerated the insertion well without immediate complications. Reason for block:procedure for pain

## 2022-09-12 NOTE — Anesthesia Preprocedure Evaluation (Signed)
Anesthesia Evaluation  Patient identified by MRN, date of birth, ID band Patient awake    Reviewed: Allergy & Precautions, NPO status , Patient's Chart, lab work & pertinent test results  Airway Mallampati: IV  TM Distance: >3 FB Neck ROM: full    Dental  (+) Teeth Intact   Pulmonary neg pulmonary ROS   Pulmonary exam normal        Cardiovascular Exercise Tolerance: Good negative cardio ROS Normal cardiovascular exam     Neuro/Psych    GI/Hepatic negative GI ROS,,,  Endo/Other    Morbid obesity  Renal/GU   negative genitourinary   Musculoskeletal   Abdominal   Peds  Hematology negative hematology ROS (+)   Anesthesia Other Findings Past Medical History: No date: PCOS (polycystic ovarian syndrome)  Past Surgical History: 2011: KNEE SURGERY  BMI    Body Mass Index: 51.40 kg/m      Reproductive/Obstetrics (+) Pregnancy                             Anesthesia Physical Anesthesia Plan  ASA: 3  Anesthesia Plan: Epidural   Post-op Pain Management:    Induction:   PONV Risk Score and Plan:   Airway Management Planned: Natural Airway  Additional Equipment:   Intra-op Plan:   Post-operative Plan:   Informed Consent: I have reviewed the patients History and Physical, chart, labs and discussed the procedure including the risks, benefits and alternatives for the proposed anesthesia with the patient or authorized representative who has indicated his/her understanding and acceptance.     Dental Advisory Given  Plan Discussed with: Anesthesiologist  Anesthesia Plan Comments: (Patient reports no bleeding problems and no anticoagulant use.   Patient consented for risks of anesthesia including but not limited to:  - adverse reactions to medications - risk of bleeding, infection and or nerve damage from epidural that could lead to paralysis - risk of headache or failed  epidural - nerve damage due to positioning - that if epidural is used for C-section that there is a chance of epidural failure requiring spinal placement or conversion to GA - Damage to heart, brain, lungs, other parts of body or loss of life  Patient voiced understanding.)       Anesthesia Quick Evaluation

## 2022-09-12 NOTE — Progress Notes (Signed)
LABOR NOTE   Katherine Whitaker 27 y.o.GP@ at [redacted]w[redacted]d  SUBJECTIVE:  Pt denies pain or urge to push Analgesia: Epidural  OBJECTIVE:  BP 119/66   Pulse 86   Temp 98.6 F (37 C) (Oral)   Resp 20   Ht 5\' 2"  (1.575 m)   Wt 127.5 kg   LMP 12/01/2021 (Exact Date)   SpO2 95%   BMI 51.40 kg/m  No intake/output data recorded.  She has shown cervical change. CERVIX: 10/100/+1:   SVE:   Dilation: 10 Effacement (%): 100 Station: Plus 1 Exam by:: Janee Morn CNM CONTRACTIONS: regular, every 2-4 minutes FHR: Fetal heart tracing reviewed. Baseline: 150 bpm, Variability: Good {> 6 bpm), Accelerations: Non-reactive but appropriate for gestational age, and Decelerations: early, variable , and late  Category II    Labs: Lab Results  Component Value Date   WBC 15.3 (H) 09/11/2022   HGB 12.4 09/11/2022   HCT 38.1 09/11/2022   MCV 84.1 09/11/2022   PLT 236 09/11/2022    ASSESSMENT: 1) Labor curve reviewed.       Progress: Active phase labor.     Membranes: ruptured, meconium moderate     IUPC       Principal Problem:   Labor and delivery, indication for care Obesity  PLAN: continue present management and start pushing   Doreene Burke, CNM  09/12/2022 3:22 AM

## 2022-09-12 NOTE — Progress Notes (Signed)
LABOR NOTE   Katherine Whitaker 27 y.o.GP@ at [redacted]w[redacted]d  SUBJECTIVE:  Feeling more comfortable with Epidural placement  Analgesia: Epidural  OBJECTIVE:  BP 116/77 (BP Location: Right Arm)   Pulse 84   Temp 99.6 F (37.6 C) (Oral)   Resp 20   Ht 5\' 2"  (1.575 m)   Wt 127.5 kg   LMP 12/01/2021 (Exact Date)   BMI 51.40 kg/m  No intake/output data recorded.  She has shown cervical change. CERVIX: 7-8 cm:  100%:   -2:   mid position:   soft SVE:   Dilation: 8 Effacement (%): 80 Station: -2 Exam by:: Janee Morn CNM CONTRACTIONS: regular, every 2-4 minutes FHR: Fetal heart tracing reviewed. Baseline: 140 bpm, Variability: Good {> 6 bpm), Accelerations: Non-reactive but appropriate for gestational age, and Decelerations: variable x 1  Category II     Labs: Lab Results  Component Value Date   WBC 15.3 (H) 09/11/2022   HGB 12.4 09/11/2022   HCT 38.1 09/11/2022   MCV 84.1 09/11/2022   PLT 236 09/11/2022    ASSESSMENT: 1) Labor curve reviewed.       Progress: Active phase labor.     Membranes: ruptured, meconium moderate     IUPC placed         Principal Problem:   Labor and delivery, indication for care Obesity affecting pregnancy  PLAN: Discussed IUPC placement for further assessment of contraction strength. Discussed risk and benefits. Patient and her partner are in agreement to placement. Titrate pitocin to 200-250 mvu's .    Doreene Burke, CNM  09/12/2022 1:27 AM

## 2022-09-12 NOTE — Discharge Summary (Shared)
Postpartum Discharge Summary  Date of Service updated***     Patient Name: Katherine Whitaker DOB: 02-11-1995 MRN: [id number]  Date of admission: 09/11/2022 Delivery date:09/12/2022 Delivering provider: Doreene Burke Date of discharge: 09/12/2022  Admitting diagnosis: Labor and delivery, indication for care [O75.9] Intrauterine pregnancy: [redacted]w[redacted]d     Secondary diagnosis:  Principal Problem:   Labor and delivery, indication for care  Additional problems: obesity     Discharge diagnosis: Term Pregnancy Delivered                                              Post partum procedures:{Postpartum procedures:23558} Augmentation: Pitocin, Cytotec, and cooks catheter Complications: None  Hospital course: Induction of Labor With Vaginal Delivery   27 y.o. yo G1P0000 at [redacted]w[redacted]d was admitted to the hospital 09/11/2022 for induction of labor.  Indication for induction: elevated BMI.  Patient had an labor course uncomplicated. Membrane Rupture Time/Date: 8:55 PM,09/11/2022  Delivery Method:Vaginal, Spontaneous Operative Delivery:N/A Episiotomy: None Lacerations: Bilateral labial abrasions. Right hemostatic -no repair. Left-repaired   Details of delivery can be found in separate delivery note.  Patient had a postpartum course complicated by***. Patient is discharged home 09/12/22.  Newborn Data: Birth date:09/12/2022 Birth time:4:18 AM Gender:Female Living status:Living Apgars:8 ,9  Weight:   Magnesium Sulfate received: No BMZ received: No Rhophylac:{Rhophylac received:30440032} MMR:No T-DaP:Given prenatally Flu: N/A Transfusion:{Transfusion received:30440034}  Physical exam  Vitals:   09/12/22 0245 09/12/22 0250 09/12/22 0255 09/12/22 0328  BP:    130/84  Pulse:    (!) 125  Resp:      Temp:      TempSrc:      SpO2: 95% 95% 95%   Weight:      Height:       General: {Exam; general:21111117} Lochia: {Desc; appropriate/inappropriate:30686::"appropriate"} Uterine Fundus: {Desc;  firm/soft:30687} Incision: {Exam; incision:21111123} DVT Evaluation: {Exam; dvt:2111122} Labs: Lab Results  Component Value Date   WBC 15.3 (H) 09/11/2022   HGB 12.4 09/11/2022   HCT 38.1 09/11/2022   MCV 84.1 09/11/2022   PLT 236 09/11/2022      Latest Ref Rng & Units 07/15/2021    8:45 AM  CMP  Glucose 70 - 99 mg/dL 85   BUN 6 - 20 mg/dL 9   Creatinine 0.86 - 5.78 mg/dL 4.69   Sodium 629 - 528 mmol/L 141   Potassium 3.5 - 5.2 mmol/L 4.7   Chloride 96 - 106 mmol/L 103   CO2 20 - 29 mmol/L 22   Calcium 8.7 - 10.2 mg/dL 9.5   Total Protein 6.0 - 8.5 g/dL 6.6   Total Bilirubin 0.0 - 1.2 mg/dL 0.2   Alkaline Phos 44 - 121 IU/L 99   AST 0 - 40 IU/L 20   ALT 0 - 32 IU/L 33    Edinburgh Score:    01/23/2022    1:28 PM  Edinburgh Postnatal Depression Scale Screening Tool  I have been able to laugh and see the funny side of things. 0  I have looked forward with enjoyment to things. 0  I have blamed myself unnecessarily when things went wrong. 0  I have been anxious or worried for no good reason. 0  I have felt scared or panicky for no good reason. 0  Things have been getting on top of me. 0  I have been so unhappy that I have  had difficulty sleeping. 0  I have felt sad or miserable. 0  I have been so unhappy that I have been crying. 0  The thought of harming myself has occurred to me. 0  Edinburgh Postnatal Depression Scale Total 0      After visit meds:  Allergies as of 09/12/2022   No Known Allergies   Med Rec must be completed prior to using this [facility]***        Discharge home in stable condition Infant Feeding: {Baby feeding:23562} Infant Disposition:{CHL IP OB HOME WITH AOZHYQ:65784} Discharge instruction: per After Visit Summary and Postpartum booklet. Activity: Advance as tolerated. Pelvic rest for 6 weeks.  Diet: {OB diet:21111121} Anticipated Birth Control: {Birth Control:23956} Postpartum Appointment:{Outpatient follow up:23559} Additional  Postpartum F/U: {PP Procedure:23957} Future Appointments:No future appointments. Follow up Visit:      09/12/2022 Doreene Burke, CNM

## 2022-09-13 LAB — FETAL SCREEN: Fetal Screen: NEGATIVE

## 2022-09-13 MED ORDER — RHO D IMMUNE GLOBULIN 1500 UNIT/2ML IJ SOSY
300.0000 ug | PREFILLED_SYRINGE | Freq: Once | INTRAMUSCULAR | Status: AC
  Administered 2022-09-13: 300 ug via INTRAVENOUS
  Filled 2022-09-13: qty 2

## 2022-09-13 MED ORDER — IBUPROFEN 600 MG PO TABS: 600.0000 mg | ORAL_TABLET | Freq: Four times a day (QID) | ORAL | Status: DC

## 2022-09-13 MED ORDER — ACETAMINOPHEN 325 MG PO TABS: 650.0000 mg | ORAL_TABLET | ORAL | Status: AC | PRN

## 2022-09-13 NOTE — Lactation Note (Signed)
This note was copied from a baby's chart. Lactation Consultation Note  Patient Name: Katherine Whitaker [id number] Date: 09/13/2022 Age:28 hours Reason for consult: Follow-up assessment;Primapara;Maternal discharge;Term   Maternal Data Has patient been taught Hand Expression?: Yes Does the patient have breastfeeding experience prior to this delivery?: No  Feeding Mother's Current Feeding Choice: Breast Milk BAby has not breastfed since 0730, baby showing feeding cues, mom preparing to feed baby, offered assistance with observing latch but mom declines, question answered regarding baby having gagginess at times, mom states baby is latching well and that she hears swallows.    LATCH Score Latch:  (I did not observe a feeding)                  Lactation Tools Discussed/Used    Interventions  LC name and no written on white board for mom to call for questions or assistance  Discharge Discharge Education: Engorgement and breast care Pump: Personal WIC Program: No  Consult Status Consult Status: PRN    Katherine Whitaker 09/13/2022, 11:30 AM

## 2022-09-13 NOTE — Anesthesia Postprocedure Evaluation (Signed)
Anesthesia Post Note  Patient: Katherine Whitaker  Procedure(s) Performed: AN AD HOC LABOR EPIDURAL  Patient location during evaluation: Mother Baby Anesthesia Type: Epidural Level of consciousness: awake and alert Pain management: pain level controlled Vital Signs Assessment: post-procedure vital signs reviewed and stable Respiratory status: spontaneous breathing, nonlabored ventilation and respiratory function stable Cardiovascular status: stable Postop Assessment: no headache, no backache and epidural receding Anesthetic complications: no   No notable events documented.   Last Vitals:  Vitals:   09/12/22 2322 09/13/22 0809  BP: 118/63 129/83  Pulse: 88 79  Resp: 18 20  Temp: 36.9 C (!) 36.4 C  SpO2: 98% 100%    Last Pain:  Vitals:   09/13/22 0820  TempSrc:   PainSc: 3                  Louie Boston

## 2022-09-13 NOTE — Discharge Instructions (Signed)
Discharge instructions Bleeding: Your bleeding could continue up to 6 weeks, the flow should gradually decrease and the color should become dark then lightened over the next couple of weeks. If you notice you are bleeding heavily or passing clots larger than the size of your fist, PLEASE call your physician. No TAMPONS, DOUCHING, ENEMAS OR SEXUAL INTERCOURSE for 6 weeks. Stitches: Shower daily with mild soap and water. Stitches will dissolve over the next couple of weeks, if you experience any discomfort in the vaginal area you may sit in warm water 15-20 minutes, 3-4 times per day. Just enough water to cover vaginal area. AfterPains: This is the uterus contracting back to its normal position and size. Use medications prescribed or recommended by your physician to help relieve this discomfort. Bowels/Hemorrhoids: Drink plenty of water and stay active. Increase fiber, fresh fruits and vegetables in your diet. Rest/Activity: Rest when the baby is resting;  Do not lift > 10 lbs for 6 weeks. No driving for 1-2 weeks. Bathing: Shower daily! Diet: Continue daily prenatal vitamin and iron until your follow up visit to help replenish nutrients and vitamins. If breastfeeding eat extra calories and increase your fluid intake to 12 glasses a day. Contraception: Consult with your provider on what method of birth control you would like to use. Breastfeeding: You may have a slight fever when your milk comes in, but it should go away on its own. If it does not, and rises above 101.0 please call the doctor. Bottlefeeding: wear a snug fitting bra without underwires continuously for 3-5days, avoid any nipple/breast stimulation. If engorgement occurs, take ibuprofen as prescribed and apply fresh green cabbage leaves directly to your breasts inside the bra cups. Postpartum "BLUES": It is common to emotional days after delivery, however if it persist for greater than 2 weeks or if you feel concerned please let your physician  know immediately. This is hormone driven and nothing you can control so please let someone know how you feel. Follow Up Visit: Please schedule a follow up visit with your delivering provider  Call office if you have any of the following: headache, visual changes, fever >101.0 F, chills, breast concerns, excessive vaginal bleeding, incision drainage or problems, leg pain or redness, depression or any other concerns.  For concerns about your baby, please call your pediatrician For breastfeeding concerns, the lactation consultant can be reached at [phone]

## 2022-09-13 NOTE — Progress Notes (Signed)
Patient discharged. Discharge instructions given. Patient verbalizes understanding. Transported by RN. 

## 2022-09-14 LAB — RHOGAM INJECTION: Unit division: 0

## 2022-09-15 ENCOUNTER — Other Ambulatory Visit: Payer: Self-pay

## 2022-09-15 ENCOUNTER — Encounter: Payer: Self-pay | Admitting: Emergency Medicine

## 2022-09-15 NOTE — ED Triage Notes (Signed)
Patient ambulatory to triage with steady gait, without difficulty or distress noted; pt reports persistent vag bleeding since uncomplicated vag delivery 4wks ago; st tonight that she passed clot

## 2022-09-16 ENCOUNTER — Emergency Department
Admission: EM | Admit: 2022-09-16 | Discharge: 2022-09-16 | Disposition: A | Discharge status: Home / Self Care | Payer: No Typology Code available for payment source | Attending: Emergency Medicine | Admitting: Emergency Medicine

## 2022-09-16 ENCOUNTER — Emergency Department: Payer: No Typology Code available for payment source

## 2022-09-16 DIAGNOSIS — N939 Abnormal uterine and vaginal bleeding, unspecified: Secondary | ICD-10-CM

## 2022-09-16 LAB — PREGNANCY, URINE: Preg Test, Ur: POSITIVE — AB

## 2022-09-16 LAB — HCG, QUANTITATIVE, PREGNANCY: hCG, Beta Chain, Quant, S: 367 m[IU]/mL — ABNORMAL HIGH (ref <5)

## 2022-09-16 NOTE — ED Provider Notes (Signed)
[facility] Provider Note    Event Date/Time   First MD Initiated Contact with Patient 09/16/22 [phone]     (approximate)   History   Vaginal Bleeding   HPI  Katherine Whitaker is a 27 y.o. female G1P1 who had spontaneous vaginal delivery at 61 and 5 on 09/11/2022 who presents to the emergency department with vaginal bleeding.  States that her vaginal bleeding has been steady and she is using a new pad every 2-3 hours but states she is not soaking through it.  Tonight she passed a large clot.  She reports cramping pain like contractions with breast-feeding.  No abnormal odor or other discharge.  No abdominal pain currently.  No fever.   History provided by patient, husband.    Past Medical History:  Diagnosis Date   PCOS (polycystic ovarian syndrome)     Past Surgical History:  Procedure Laterality Date   KNEE SURGERY  2011    MEDICATIONS:  Prior to Admission medications   Medication Sig Start Date End Date Taking? Authorizing Provider  acetaminophen (TYLENOL) 325 MG tablet Take 2 tablets (650 mg total) by mouth every 4 (four) hours as needed (for pain scale < 4). 09/13/22   Free, Lindalou Hose, CNM  Cyanocobalamin (B-12 PO) Take by mouth.    [provider]  ibuprofen (ADVIL) 600 MG tablet Take 1 tablet (600 mg total) by mouth every 6 (six) hours. 09/13/22   Free, Lindalou Hose, CNM  Prenatal Vit-Fe Fumarate-FA (PRENATAL PO) Take by mouth.    [provider]  VITAMIN D PO Take by mouth.    [provider]    Physical Exam   Triage Vital Signs: ED Triage Vitals  Encounter Vitals Group     BP 09/16/22 0012 (!) 147/93     Systolic BP Percentile --      Diastolic BP Percentile --      Pulse Rate 09/16/22 0012 73     Resp 09/16/22 0012 20     Temp 09/16/22 0012 98.5 F (36.9 C)     Temp Source 09/16/22 0012 Oral     SpO2 09/16/22 0012 100 %     Weight 09/16/22 0027 269 lb 10 oz (122.3 kg)     Height 09/15/22 2354 5\' 3"  (1.6 m)      Head Circumference --      Peak Flow --      Pain Score 09/15/22 2354 2     Pain Loc --      Pain Education --      Exclude from Growth Chart --     Most recent vital signs: Vitals:   09/16/22 0012 09/16/22 0149  BP: (!) 147/93 132/89  Pulse: 73 88  Resp: 20 16  Temp: 98.5 F (36.9 C) 98.1 F (36.7 C)  SpO2: 100% 96%    CONSTITUTIONAL: Alert, responds appropriately to questions. Well-appearing; well-nourished HEAD: Normocephalic, atraumatic EYES: Conjunctivae clear, pupils appear equal, sclera nonicteric ENT: normal nose; moist mucous membranes NECK: Supple, normal ROM CARD: RRR; S1 and S2 appreciated RESP: Normal chest excursion without splinting or tachypnea; breath sounds clear and equal bilaterally; no wheezes, no rhonchi, no rales, no hypoxia or respiratory distress, speaking full sentences ABD/GI: Uterus palpated above the pubic symphysis but is firm, soft abdomen, nontender BACK: The back appears normal EXT: Normal ROM in all joints; no deformity noted, no edema SKIN: Normal color for age and race; warm; no rash on exposed skin NEURO: Moves all  extremities equally, normal speech PSYCH: The patient's mood and manner are appropriate.   ED Results / Procedures / Treatments   LABS: (all labs ordered are listed, but only abnormal results are displayed) Labs Reviewed  CBC WITH DIFFERENTIAL/PLATELET - Abnormal; Notable for the following components:      Result Value   WBC 12.9 (*)    Neutro Abs 9.3 (*)    All other components within normal limits  COMPREHENSIVE METABOLIC PANEL - Abnormal; Notable for the following components:   Glucose, Bld 103 (*)    Calcium 8.7 (*)    Albumin 3.2 (*)    All other components within normal limits  URINALYSIS, ROUTINE W REFLEX MICROSCOPIC - Abnormal; Notable for the following components:   Color, Urine RED (*)    APPearance HAZY (*)    Glucose, UA   (*)    Value: TEST NOT REPORTED DUE TO COLOR INTERFERENCE OF URINE PIGMENT    Hgb urine dipstick   (*)    Value: TEST NOT REPORTED DUE TO COLOR INTERFERENCE OF URINE PIGMENT   Bilirubin Urine   (*)    Value: TEST NOT REPORTED DUE TO COLOR INTERFERENCE OF URINE PIGMENT   Ketones, ur   (*)    Value: TEST NOT REPORTED DUE TO COLOR INTERFERENCE OF URINE PIGMENT   Protein, ur   (*)    Value: TEST NOT REPORTED DUE TO COLOR INTERFERENCE OF URINE PIGMENT   Nitrite   (*)    Value: TEST NOT REPORTED DUE TO COLOR INTERFERENCE OF URINE PIGMENT   Leukocytes,Ua   (*)    Value: TEST NOT REPORTED DUE TO COLOR INTERFERENCE OF URINE PIGMENT   All other components within normal limits  HCG, QUANTITATIVE, PREGNANCY - Abnormal; Notable for the following components:   hCG, Beta Chain, Quant, S 367 (*)    All other components within normal limits  PREGNANCY, URINE - Abnormal; Notable for the following components:   Preg Test, Ur POSITIVE (*)    All other components within normal limits     EKG:   RADIOLOGY: My personal review and interpretation of imaging: No retained products of conception.  I have personally reviewed all radiology reports.   US PELVIS (TRANSABDOMINAL ONLY)  Result Date: 09/16/2022 CLINICAL DATA:  Initial evaluation for vaginal bleeding, recent delivery. EXAM: TRANSABDOMINAL ULTRASOUND OF PELVIS TECHNIQUE: Transabdominal ultrasound examination of the pelvis was performed including evaluation of the uterus, ovaries, adnexal regions, and pelvic cul-de-sac. COMPARISON:  None Available. FINDINGS: Uterus Measurements: 19.3 x 7.3 x 12.7 cm = volume: 934.4 mL. Uterus is anteverted. Uterine enlargement consistent with recent pregnancy. No discrete fibroid or other myometrial abnormality. Endometrium Thickness: 16.6 mm. No visible focal abnormality or abnormal vascularity. Right ovary Measurements: 4.0 x 2.2 x 2.6 cm = volume: 11.6 mL. Normal appearance/no adnexal mass. Left ovary Measurements: 3.7 x 1.7 x 2.4 cm = volume: 8.0 mL. Normal appearance/no adnexal mass. Other  findings:  No abnormal free fluid. IMPRESSION: 1. Endometrial stripe measures 16.6 mm in thickness with no visible focal abnormality or abnormal vascularity. Please note that the lack of vascularity does not definitely exclude retained products of conception, and clinical correlation is advised. 2. Enlarged postpartum uterus. 3. Normal sonographic appearance of the ovaries. No adnexal mass or free fluid. Electronically Signed   By: Rise Mu M.D.   On: 09/16/2022 02:42     PROCEDURES:  Critical Care performed: No      Procedures    IMPRESSION / MDM / ASSESSMENT  AND PLAN / ED COURSE  I reviewed the triage vital signs and the nursing notes.    Patient here with delivery 4 days ago with vaginal bleeding and passing a large clot at home.  No hemorrhaging.  Hemodynamically stable.     DIFFERENTIAL DIAGNOSIS (includes but not limited to):   Normal vaginal bleeding post delivery 4 days ago, retained products of conception, doubt endometritis   Patient's presentation is most consistent with acute complicated illness / injury requiring diagnostic workup.   PLAN: Will check labs today including hemoglobin and hCG.  Will obtain transvaginal ultrasound to evaluate for retained products of conception.  Uterus appears to be firm on my exam and she has no abdominal tenderness, fevers, discharge or odor.  Low suspicion for endometritis.  Patient is breast-feeding and states that this increases her pain and bleeding.  We did discuss that this is normal.   MEDICATIONS GIVEN IN ED: Medications - No data to display   ED COURSE: Patient remains hemodynamically stable.  Not passing any further clots.  Hemoglobin 12.3 which is up from 11 on 8/9.  hCG is 367 but she is only 4 days postpartum.  Transvaginal ultrasound reviewed and interpreted by myself and the radiologist and shows endometrial stripe of 16 mm but no abnormal vascularity, clot or obvious signs of retained products of  conception.  Low suspicion for avascular retained products of conception.  We discussed that given these findings I would recommend close follow-up with her OB/GYN and expectant management rather than trying a course of Cytotec which she agrees to.  We discussed again that it is normal to have cramping and passing more blood or clots when nursing.  Her uterus feels firm on exam.  We discussed bleeding return precautions.  I feel she is safe to be discharged home.  She is comfortable with this plan and reassured.   At this time, I do not feel there is any life-threatening condition present. I reviewed all nursing notes, vitals, pertinent previous records.  All lab and urine results, EKGs, imaging ordered have been independently reviewed and interpreted by myself.  I reviewed all available radiology reports from any imaging ordered this visit.  Based on my assessment, I feel the patient is safe to be discharged home without further emergent workup and can continue workup as an outpatient as needed. Discussed all findings, treatment plan as well as usual and customary return precautions.  They verbalize understanding and are comfortable with this plan.  Outpatient follow-up has been provided as needed.  All questions have been answered.   CONSULTS:  none   OUTSIDE RECORDS REVIEWED: Reviewed recent delivery note.       FINAL CLINICAL IMPRESSION(S) / ED DIAGNOSES   Final diagnoses:  Vaginal bleeding     Rx / DC Orders   ED Discharge Orders     None        Note:  This document was prepared using Dragon voice recognition software and may include unintentional dictation errors.   Shyan Scalisi, Layla Maw, DO 09/16/22 [phone]

## 2022-09-16 NOTE — ED Notes (Signed)
See triage note. Pt gave birth on Friday 09/12/22. Pt has been continuously bleeding but tonight around 2200 pt had a golf ball sized clot.

## 2022-09-16 NOTE — Discharge Instructions (Signed)
Your ultrasound showed a thickened endometrium but no obvious retained products of conception.  Recommend close follow-up with your OB/GYN as they may want to repeat your hCG level or ultrasound if bleeding continues.  Please return to the emergency department if you have severe abdominal pain, fever of 100.4 or higher, abnormal vaginal discharge or odor, bleeding heavily where you are soaking through more than a pad an hour for more than 2 straight hours, feel lightheaded like you may pass out or do pass out, have chest pain or shortness of breath.  You may alternate Tylenol 1000 mg every 6 hours as needed for pain, fever and Ibuprofen 800 mg every 6-8 hours as needed for pain, fever.  Please take Ibuprofen with food.  Do not take more than 4000 mg of Tylenol (acetaminophen) in a 24 hour period.

## 2022-09-23 ENCOUNTER — Telehealth: Payer: Self-pay

## 2022-09-23 NOTE — Telephone Encounter (Signed)
[facility]- Discharge Call Backs-Left Voicemail about the following below. 1-Do you have any questions or concerns about yourself as you heal?  C-Sec-Is your dressing off?/Vag Del? 2-Any concerns or questions about your baby? Is your baby eating, peeing,pooping well? 3-Where does your baby sleep in your home? Reviewed ABC's of safe sleep. 4-How was your stay at the hospital? 5- Did our team work together to care for you? You should be receiving a survey in the mail soon.   We would really appreciate it if you could fill that out for us and return it in the mail.  We value the feedback to make improvements and continue the great work we do.   If you have any questions please feel free to call me back at [phone]  

## 2022-10-29 ENCOUNTER — Ambulatory Visit (INDEPENDENT_AMBULATORY_CARE_PROVIDER_SITE_OTHER): Payer: No Typology Code available for payment source | Admitting: Certified Nurse Midwife

## 2022-10-29 ENCOUNTER — Encounter: Payer: Self-pay | Admitting: Certified Nurse Midwife

## 2022-10-29 NOTE — Progress Notes (Signed)
Subjective:    Katherine Whitaker is a 27 y.o. G61P1001 Caucasian female who presents for a postpartum visit. She is 6 weeks postpartum following a spontaneous vaginal delivery at 40.5 gestational weeks. Anesthesia: epidural. I have fully reviewed the prenatal and intrapartum course. Postpartum course has been normal. Baby's course has been normal. Baby is feeding by  breast and bottle  . Bleeding no bleeding. Bowel function is normal. Bladder function is normal. Patient is not sexually active. Contraception method is condoms. Postpartum depression screening: negative. Score 3.  Last pap 07/10/2021 and was normal.  The following portions of the patient's history were reviewed and updated as appropriate: allergies, current medications, past medical history, past surgical history and problem list.  Review of Systems Pertinent items are noted in HPI.   There were no vitals filed for this visit. No LMP recorded.  Objective:   General:  alert, cooperative and no distress   Breasts:  deferred, no complaints  Lungs: clear to auscultation bilaterally  Heart:  regular rate and rhythm  Abdomen: soft, nontender   Vulva: normal  Vagina: normal vagina  Cervix:  closed  Corpus: Well-involuted  Adnexa:  Non-palpable  Rectal Exam: no hemorrhoids        Assessment:   Postpartum exam 6 wks s/p SVD Breast and bottle feeding Depression screening Contraception counseling   Plan:  : condoms Follow up in: 8 months for annual or earlier if needed  Doreene Burke, CNM

## 2022-10-29 NOTE — Patient Instructions (Signed)

## 2023-02-11 ENCOUNTER — Ambulatory Visit (INDEPENDENT_AMBULATORY_CARE_PROVIDER_SITE_OTHER): Payer: No Typology Code available for payment source

## 2023-02-11 VITALS — BP 100/72 | HR 59 | Ht 63.0 in | Wt 262.0 lb

## 2023-02-11 DIAGNOSIS — K2960 Other gastritis without bleeding: Secondary | ICD-10-CM | POA: Diagnosis not present

## 2023-02-11 DIAGNOSIS — F53 Postpartum depression: Secondary | ICD-10-CM | POA: Diagnosis not present

## 2023-02-11 NOTE — Progress Notes (Signed)
   GYN ENCOUNTER  Encounter for postpartum concerns  Subjective  HPI: Katherine Whitaker is a 28 y.o. G1P1001 who presents today for concerns since giving birth. Hasn't felt well in last few months. Has indigestion with trapped gas that stays for hours to the point where she will end up vomiting.   Diagnosed with PCOS prior to getting pregnant. Feels that symptoms have returned. Now with increasing fatigue despite infant sleeping better. Feels overwhelmed because she feels like she can't catch up health-wise and can't keep up with her home.  Past Medical History:  Diagnosis Date   PCOS (polycystic ovarian syndrome)    Past Surgical History:  Procedure Laterality Date   KNEE SURGERY  2011   OB History     Gravida  1   Para  1   Term  1   Preterm  0   AB  0   Living  1      SAB  0   IAB  0   Ectopic  0   Multiple  0   Live Births  1          No Known Allergies  Review of Systems  12 point ROS negative except for pertinent positives noted in HPO above.   Objective  BP 100/72   Pulse (!) 59   Ht 5' 3 (1.6 m)   Wt 262 lb (118.8 kg)   LMP 02/03/2023 (Approximate)   Breastfeeding Yes   BMI 46.41 kg/m   Physical examination GENERAL APPEARANCE: alert, well appearing, tearful when discussing concerns LUNGS: normal work of breathing HEART: normal heart rate  Assessment/Plan - Discussed that even with an infant who is sleeping well, being a new parent can feel overwhelming at times. Also reviewed that postpartum depression/anxiety can occur any time during the first year after giving birth. She is tearful today after explaining how overwhelmed she feels, and we discussed options for managing postpartum health including talk therapy and medication. She would like to avoid medication at this time but is open to therapy. I provided her with website info for [employer] as well as a referral to [facility]. - Thyroid  labs  drawn today. Also Hgb A1c per patient request. Recommended follow up with PCP for ongoing diabetes concerns.  - GI referral provided for ongoing reflux/gas/vomiting.   Lauraine PARAS Aleathia Purdy, CNM  02/11/23 10:04 AM

## 2023-02-12 LAB — TSH+FREE T4
Free T4: 1.12 ng/dL (ref 0.82–1.77)
TSH: 0.833 u[IU]/mL (ref 0.450–4.500)

## 2023-02-12 LAB — HEMOGLOBIN A1C
Est. average glucose Bld gHb Est-mCnc: 108 mg/dL
Hgb A1c MFr Bld: 5.4 % (ref 4.8–5.6)

## 2023-02-25 ENCOUNTER — Ambulatory Visit: Payer: No Typology Code available for payment source | Admitting: Certified Nurse Midwife

## 2023-03-10 ENCOUNTER — Ambulatory Visit: Payer: No Typology Code available for payment source

## 2023-03-23 ENCOUNTER — Encounter: Payer: Self-pay | Admitting: Gastroenterology

## 2023-03-23 ENCOUNTER — Ambulatory Visit (INDEPENDENT_AMBULATORY_CARE_PROVIDER_SITE_OTHER): Payer: No Typology Code available for payment source | Admitting: Gastroenterology

## 2023-03-23 VITALS — BP 120/86 | HR 66 | Temp 98.7°F | Wt 255.0 lb

## 2023-03-23 DIAGNOSIS — R1013 Epigastric pain: Secondary | ICD-10-CM

## 2023-03-23 MED ORDER — OMEPRAZOLE 40 MG PO CPDR: 40.0000 mg | DELAYED_RELEASE_CAPSULE | Freq: Every day | ORAL | 1 refills | Status: DC

## 2023-03-23 NOTE — Patient Instructions (Addendum)
The ultrasound has been scheduled for February 19th. You must arrive at 9:45 AM to register. You can not have anything to eat or drink after 4:00 AM the morning of.  If you need to cancel or reschedule, please call scheduling directly at [phone]

## 2023-03-23 NOTE — Progress Notes (Signed)
[name], MD [address]  [address]  [place], [place] [address]  Main: [phone]  Fax: [phone]    Gastroenterology Consultation  Referring Provider:     No ref. provider found Primary Care Physician:  Berniece Salines, FNP Primary Gastroenterologist:  Dr. [name] Reason for Consultation: Epigastric pain        HPI:   Katherine Whitaker is a 28 y.o. female referred by Berniece Salines, FNP  for consultation & management of epigastric pain.  Patient states that since her delivery in December, she has been experiencing acute episodes of sharp epigastric pain radiating to bilateral upper quadrants as well as to her back associated with nausea.  These episodes are triggered generally after a meal.  She thought it was a gallbladder pain and has cut back on eating greasy foods.  She sees integrative medicine specialist, who recommended to cut back on gluten and daily which she is working on for last 1 week.  She denies any abdominal bloating, diarrhea.  She reports that she used to have reflux before pregnancy for which she used to take Tums.  Currently, she has not tried any medications for her symptoms.  Does not smoke or drink alcohol She is a history teacher for middle school  NSAIDs: None  Antiplts/Anticoagulants/Anti thrombotics: None  GI Procedures: None She denies any family history of GI malignancy  Past Medical History:  Diagnosis Date   PCOS (polycystic ovarian syndrome)     Past Surgical History:  Procedure Laterality Date   KNEE SURGERY  2011     Current Outpatient Medications:    acetaminophen (TYLENOL) 325 MG tablet, Take 2 tablets (650 mg total) by mouth every 4 (four) hours as needed (for pain scale < 4)., Disp: , Rfl:    Cyanocobalamin (B-12 PO), Take by mouth., Disp: , Rfl:    DIGESTIVE ENZYMES PO, Take by mouth., Disp: , Rfl:    ibuprofen (ADVIL) 600 MG tablet, Take 1 tablet (600 mg total) by mouth every 6 (six) hours., Disp: ,  Rfl:    omeprazole (PRILOSEC) 40 MG capsule, Take 1 capsule (40 mg total) by mouth daily., Disp: 30 capsule, Rfl: 1   Prenatal Vit-Fe Fumarate-FA (PRENATAL PO), Take by mouth., Disp: , Rfl:    VITAMIN D PO, Take by mouth., Disp: , Rfl:    Family History  Problem Relation Age of Onset   Healthy Mother    Healthy Father    Seizures Sister    Healthy Brother    Diabetes Maternal Grandmother    Alzheimer's disease Maternal Grandmother    Bipolar disorder Maternal Grandfather    Melanoma Paternal Grandmother    Melanoma Paternal Grandfather    Heart disease Paternal Grandfather    Polycystic ovary syndrome Other        Paternal First Cousin   Alzheimer's disease Other        MGGM     Social History   Tobacco Use   Smoking status: Never   Smokeless tobacco: Never  Vaping Use   Vaping status: Never Used  Substance Use Topics   Alcohol use: Not Currently    Comment: Less than 1 week   Drug use: No    Allergies as of 03/23/2023   (No Known Allergies)    Review of Systems:    All systems reviewed and negative except where noted in HPI.   Physical Exam:  BP 120/86 (BP Location: Left Arm, Patient Position:  Sitting, Cuff Size: Large)   Pulse 66   Temp 98.7 F (37.1 C) (Oral)   Wt 255 lb (115.7 kg)   BMI 45.17 kg/m  No LMP recorded.  General:   Alert,  Well-developed, well-nourished, pleasant and cooperative in NAD Head:  Normocephalic and atraumatic. Eyes:  Sclera clear, no icterus.   Conjunctiva pink. Ears:  Normal auditory acuity. Nose:  No deformity, discharge, or lesions. Mouth:  No deformity or lesions,oropharynx pink & moist. Neck:  Supple; no masses or thyromegaly. Lungs:  Respirations even and unlabored.  Clear throughout to auscultation.   No wheezes, crackles, or rhonchi. No acute distress. Heart:  Regular rate and rhythm; no murmurs, clicks, rubs, or gallops. Abdomen:  Normal bowel sounds. Soft, non-tender and non-distended without masses,  hepatosplenomegaly or hernias noted.  No guarding or rebound tenderness.   Rectal: Not performed Msk:  Symmetrical without gross deformities. Good, equal movement & strength bilaterally. Pulses:  Normal pulses noted. Extremities:  No clubbing or edema.  No cyanosis. Neurologic:  Alert and oriented x3;  grossly normal neurologically. Skin:  Intact without significant lesions or rashes. No jaundice. Psych:  Alert and cooperative. Normal mood and affect.  Imaging Studies: No abdominal imaging  Assessment and Plan:   Katherine Whitaker is a 28 y.o. pleasant Caucasian female with morbid obesity, BMI 45 is seen in consultation for 2 months history of intermittent episodes of epigastric pain radiating to the back, triggered after a meal  Recommend right upper quadrant ultrasound +/- HIDA scan Start omeprazole 40 mg once daily before meals Discussed about weight loss, healthy lifestyle   Follow up as needed   [name], MD

## 2023-03-24 ENCOUNTER — Observation Stay: Payer: No Typology Code available for payment source

## 2023-03-24 ENCOUNTER — Inpatient Hospital Stay
Admission: EM | Admit: 2023-03-24 | Discharge: 2023-03-26 | DRG: 417 | Disposition: A | Discharge status: Home / Self Care | Payer: No Typology Code available for payment source | Attending: Internal Medicine | Admitting: Internal Medicine

## 2023-03-24 ENCOUNTER — Other Ambulatory Visit: Payer: Self-pay

## 2023-03-24 ENCOUNTER — Emergency Department: Payer: No Typology Code available for payment source

## 2023-03-24 DIAGNOSIS — Z6841 Body Mass Index (BMI) 40.0 and over, adult: Secondary | ICD-10-CM

## 2023-03-24 DIAGNOSIS — K8000 Calculus of gallbladder with acute cholecystitis without obstruction: Secondary | ICD-10-CM

## 2023-03-24 DIAGNOSIS — R109 Unspecified abdominal pain: Secondary | ICD-10-CM | POA: Diagnosis not present

## 2023-03-24 DIAGNOSIS — Z8249 Family history of ischemic heart disease and other diseases of the circulatory system: Secondary | ICD-10-CM

## 2023-03-24 DIAGNOSIS — E66813 Obesity, class 3: Secondary | ICD-10-CM | POA: Diagnosis present

## 2023-03-24 DIAGNOSIS — Z808 Family history of malignant neoplasm of other organs or systems: Secondary | ICD-10-CM

## 2023-03-24 DIAGNOSIS — Z820 Family history of epilepsy and other diseases of the nervous system: Secondary | ICD-10-CM | POA: Diagnosis not present

## 2023-03-24 DIAGNOSIS — K660 Peritoneal adhesions (postprocedural) (postinfection): Secondary | ICD-10-CM | POA: Diagnosis present

## 2023-03-24 DIAGNOSIS — R1011 Right upper quadrant pain: Secondary | ICD-10-CM

## 2023-03-24 DIAGNOSIS — K851 Biliary acute pancreatitis without necrosis or infection: Secondary | ICD-10-CM | POA: Diagnosis present

## 2023-03-24 DIAGNOSIS — E282 Polycystic ovarian syndrome: Secondary | ICD-10-CM | POA: Diagnosis present

## 2023-03-24 DIAGNOSIS — K819 Cholecystitis, unspecified: Secondary | ICD-10-CM | POA: Diagnosis present

## 2023-03-24 DIAGNOSIS — K760 Fatty (change of) liver, not elsewhere classified: Secondary | ICD-10-CM | POA: Diagnosis present

## 2023-03-24 DIAGNOSIS — Z79899 Other long term (current) drug therapy: Secondary | ICD-10-CM | POA: Diagnosis not present

## 2023-03-24 DIAGNOSIS — Z833 Family history of diabetes mellitus: Secondary | ICD-10-CM | POA: Diagnosis not present

## 2023-03-24 DIAGNOSIS — R112 Nausea with vomiting, unspecified: Principal | ICD-10-CM

## 2023-03-24 DIAGNOSIS — K8080 Other cholelithiasis without obstruction: Secondary | ICD-10-CM

## 2023-03-24 DIAGNOSIS — K810 Acute cholecystitis: Secondary | ICD-10-CM | POA: Diagnosis not present

## 2023-03-24 DIAGNOSIS — K8062 Calculus of gallbladder and bile duct with acute cholecystitis without obstruction: Secondary | ICD-10-CM | POA: Diagnosis present

## 2023-03-24 DIAGNOSIS — K8050 Calculus of bile duct without cholangitis or cholecystitis without obstruction: Secondary | ICD-10-CM | POA: Diagnosis present

## 2023-03-24 LAB — COMPREHENSIVE METABOLIC PANEL
ALT: 404 U/L — ABNORMAL HIGH (ref 0–44)
AST: 300 U/L — ABNORMAL HIGH (ref 15–41)
Albumin: 4.3 g/dL (ref 3.5–5.0)
Alkaline Phosphatase: 159 U/L — ABNORMAL HIGH (ref 38–126)
Anion gap: 13 (ref 5–15)
BUN: 9 mg/dL (ref 6–20)
CO2: 21 mmol/L — ABNORMAL LOW (ref 22–32)
Calcium: 9.2 mg/dL (ref 8.9–10.3)
Chloride: 102 mmol/L (ref 98–111)
Creatinine, Ser: 0.76 mg/dL (ref 0.44–1.00)
GFR, Estimated: >60 mL/min (ref >60)
Glucose, Bld: 129 mg/dL — ABNORMAL HIGH (ref 70–99)
Potassium: 3.7 mmol/L (ref 3.5–5.1)
Sodium: 136 mmol/L (ref 135–145)
Total Bilirubin: 2.4 mg/dL — ABNORMAL HIGH (ref 0.0–1.2)
Total Protein: 8.1 g/dL (ref 6.5–8.1)

## 2023-03-24 LAB — CBC
HCT: 44.3 % (ref 36.0–46.0)
Hemoglobin: 14.4 g/dL (ref 12.0–15.0)
MCH: 27.1 pg (ref 26.0–34.0)
MCHC: 32.5 g/dL (ref 30.0–36.0)
MCV: 83.4 fL (ref 80.0–100.0)
Platelets: 415 10*3/uL — ABNORMAL HIGH (ref 150–400)
RBC: 5.31 MIL/uL — ABNORMAL HIGH (ref 3.87–5.11)
RDW: 13.8 % (ref 11.5–15.5)
WBC: 15.2 10*3/uL — ABNORMAL HIGH (ref 4.0–10.5)
nRBC: 0 % (ref 0.0–0.2)

## 2023-03-24 LAB — LIPASE, BLOOD: Lipase: 4227 U/L — ABNORMAL HIGH (ref 11–51)

## 2023-03-24 MED ORDER — HYDROMORPHONE HCL 1 MG/ML IJ SOLN
0.5000 mg | Freq: Once | INTRAMUSCULAR | Status: AC
  Administered 2023-03-24: 0.5 mg via INTRAVENOUS
  Filled 2023-03-24: qty 0.5

## 2023-03-24 MED ORDER — GADOBUTROL 1 MMOL/ML IV SOLN
10.0000 mL | Freq: Once | INTRAVENOUS | Status: AC | PRN
  Administered 2023-03-24: 10 mL via INTRAVENOUS

## 2023-03-24 MED ORDER — PANTOPRAZOLE SODIUM 40 MG IV SOLR
40.0000 mg | Freq: Two times a day (BID) | INTRAVENOUS | Status: DC
  Administered 2023-03-24 – 2023-03-26 (×5): 40 mg via INTRAVENOUS
  Filled 2023-03-24 (×5): qty 10

## 2023-03-24 MED ORDER — ALBUMIN HUMAN 25 % IV SOLN
25.0000 g | Freq: Once | INTRAVENOUS | Status: AC
  Administered 2023-03-24: 25 g via INTRAVENOUS
  Filled 2023-03-24: qty 100

## 2023-03-24 MED ORDER — SODIUM CHLORIDE 0.9 % IV SOLN
2.0000 g | INTRAVENOUS | Status: DC
  Administered 2023-03-24 – 2023-03-25 (×2): 2 g via INTRAVENOUS
  Filled 2023-03-24 (×3): qty 20

## 2023-03-24 MED ORDER — ONDANSETRON HCL 4 MG/2ML IJ SOLN: 4.0000 mg | Freq: Three times a day (TID) | INTRAMUSCULAR | Status: AC | PRN

## 2023-03-24 MED ORDER — KETOROLAC TROMETHAMINE 30 MG/ML IJ SOLN
30.0000 mg | Freq: Four times a day (QID) | INTRAMUSCULAR | Status: DC
  Administered 2023-03-24 – 2023-03-26 (×8): 30 mg via INTRAVENOUS
  Filled 2023-03-24 (×7): qty 1

## 2023-03-24 MED ORDER — INDOCYANINE GREEN 25 MG IV SOLR
1.2500 mg | Freq: Once | INTRAVENOUS | Status: AC
  Administered 2023-03-25: 1.25 mg via INTRAVENOUS
  Filled 2023-03-24: qty 0.5

## 2023-03-24 MED ORDER — ONDANSETRON HCL 4 MG/2ML IJ SOLN
4.0000 mg | Freq: Once | INTRAMUSCULAR | Status: AC
  Administered 2023-03-24: 4 mg via INTRAVENOUS
  Filled 2023-03-24: qty 2

## 2023-03-24 MED ORDER — ONDANSETRON 4 MG PO TBDP
4.0000 mg | ORAL_TABLET | Freq: Once | ORAL | Status: AC
  Administered 2023-03-24: 4 mg via ORAL
  Filled 2023-03-24: qty 1

## 2023-03-24 MED ORDER — SODIUM CHLORIDE 0.9 % IV SOLN: INTRAVENOUS | Status: AC

## 2023-03-24 MED ORDER — HYDRALAZINE HCL 20 MG/ML IJ SOLN: 5.0000 mg | INTRAMUSCULAR | Status: DC | PRN

## 2023-03-24 MED ORDER — ACETAMINOPHEN 10 MG/ML IV SOLN
1000.0000 mg | Freq: Four times a day (QID) | INTRAVENOUS | Status: AC
  Administered 2023-03-24 – 2023-03-25 (×4): 1000 mg via INTRAVENOUS
  Filled 2023-03-24 (×3): qty 100

## 2023-03-24 MED ORDER — HYDROMORPHONE HCL 1 MG/ML IJ SOLN
0.5000 mg | INTRAMUSCULAR | Status: DC | PRN
  Administered 2023-03-24 – 2023-03-25 (×6): 0.5 mg via INTRAVENOUS
  Filled 2023-03-24 (×7): qty 0.5

## 2023-03-24 MED ORDER — FAMOTIDINE IN NACL 20-0.9 MG/50ML-% IV SOLN
20.0000 mg | Freq: Once | INTRAVENOUS | Status: AC
  Administered 2023-03-24: 20 mg via INTRAVENOUS
  Filled 2023-03-24: qty 50

## 2023-03-24 MED ORDER — HYDROMORPHONE HCL 1 MG/ML IJ SOLN
0.5000 mg | INTRAMUSCULAR | Status: DC | PRN
  Administered 2023-03-24: 0.5 mg via INTRAVENOUS
  Filled 2023-03-24: qty 0.5

## 2023-03-24 NOTE — Assessment & Plan Note (Signed)
BMI 46.8  Major confounder

## 2023-03-24 NOTE — Progress Notes (Signed)
   03/24/23 0557  Vitals  Temp 98.6 F (37 C)  BP 114/83  MAP (mmHg) 94  BP Location Right Arm  BP Method Automatic  Patient Position (if appropriate) Lying  Pulse Rate (!) 52  Pulse Rate Source Monitor  Resp 16  MEWS COLOR  MEWS Score Color Green  Oxygen Therapy  SpO2 98 %  O2 Device Room Air  MEWS Score  MEWS Temp 0  MEWS Systolic 0  MEWS Pulse 0  MEWS RR 0  MEWS LOC 0  MEWS Score 0   New admit to unit, oriented to surroundings, call light system, fall protocol and belongings protocol. Alert and verbally responsive. Admission process complete, pt left with belongings within reach, bed in lowest position.

## 2023-03-24 NOTE — Plan of Care (Signed)

## 2023-03-24 NOTE — Assessment & Plan Note (Addendum)
Cholelithiasis Gallstone pancreatitis  Noted worsening RUQ and epigastric abdominal TTP over multiple weeks  Initially evaluated by Dr. Allegra Lai in the outpatient setting  RUQ u/s w/ noted cholelithiasis, sonographic murphy sign and borderline dilated common bile duct  T bili 2.4 AST 300/ALT404  Lipase 4000 Case preliminarily discussed w/ Dr. Servando Snare  Will plan for MRCP to assess for possible choledocholithiasis. If negative, will likely need surgical consultation for cholecystectomy  IVF hydration  Pain control  IV PPI for gastritis coverage  Antiemetics  Follow closely

## 2023-03-24 NOTE — H&P (Signed)
History and Physical    Patient: Katherine Whitaker ZOX:[id number] DOB: 13-Sep-1995 DOA: 03/24/2023 DOS: the patient was seen and examined on 03/24/2023 PCP: Berniece Salines, FNP  Patient coming from: Home  Chief Complaint:  Chief Complaint  Patient presents with   Emesis   HPI: Katherine Whitaker is a 28 y.o. female with medical history significant of obesity presenting w/  abdominal pain, gallstone pancreatitis. Pt reports recurring abdominal pain since delivering her child last year. Symptoms fairly episodic. Pain was predominantly in upper abdomen and RUQ. Pain has progressively worsened over the past several weeks. + worsening nausea. Only able to tolerate liquids for the most part for several days.  No CP or SOB. No fevers. Abd pain has had some intermittent radiation to the back. No diarrhea. Was referred for evaluation to GI yesterday for symptoms with Dr. Allegra Lai. Recommendations were for RUQ u/s. Pt had acute decompensation in pain overnight.  Presented to ER afebrile, hemodynamically stable. Satting well on RA. WBC 15, hgb 14, plt 415, Cr 0.76, AST 300, ALT 404, t bili 2.4. Lipase 4227. RUQ u/s with Cholelithiasis without wall thickening. Positive sonographic Murphy's sign. Upper limits of normal common bile duct  Review of Systems: As mentioned in the history of present illness. All other systems reviewed and are negative. Past Medical History:  Diagnosis Date   [redacted] weeks gestation of pregnancy 09/09/2022   Labor and delivery, indication for care 09/09/2022   Obesity affecting pregnancy in third trimester 07/18/2022   BMI 47, growth scans in third trimester, anesthesia consult 4/2  [redacted]w[redacted]d: EFW 40.8 %ile. AFI is 14.82 cm.   [redacted]w[redacted]d: EFW 55.3%ile, AFI 15     PCOS (polycystic ovarian syndrome)    Supervision of other normal pregnancy, antepartum 01/23/2022              Clinical Staff    Provider      Office Location     [facility]    Dating     L and 9wk [place]       Language      English    Anatomy US     normal      Flu Vaccine     offer    Genetic Screen     NIPS: negative, xy      TDaP vaccine      offer    Hgb A1C or   GTT    Early :  Third trimester : 130      Covid    UTD          LAB RESULTS       Rhogam     06/20/2022     Blood Type    A/Negat   Past Surgical History:  Procedure Laterality Date   KNEE SURGERY  2011   Social History:  reports that she has never smoked. She has never used smokeless tobacco. She reports that she does not currently use alcohol. She reports that she does not use drugs.  No Known Allergies  Family History  Problem Relation Age of Onset   Healthy Mother    Healthy Father    Seizures Sister    Healthy Brother    Diabetes Maternal Grandmother    Alzheimer's disease Maternal Grandmother    Bipolar disorder Maternal Grandfather    Melanoma Paternal Grandmother    Melanoma Paternal Grandfather    Heart disease Paternal Grandfather    Polycystic ovary syndrome Other  Paternal First Cousin   Alzheimer's disease Other        MGGM    Prior to Admission medications   Medication Sig Start Date End Date Taking? Authorizing Provider  acetaminophen (TYLENOL) 325 MG tablet Take 2 tablets (650 mg total) by mouth every 4 (four) hours as needed (for pain scale < 4). 09/13/22   Free, Lindalou Hose, CNM  Cyanocobalamin (B-12 PO) Take by mouth.    [provider]  DIGESTIVE ENZYMES PO Take by mouth.    [provider]  ibuprofen (ADVIL) 600 MG tablet Take 1 tablet (600 mg total) by mouth every 6 (six) hours. 09/13/22   Free, Lindalou Hose, CNM  omeprazole (PRILOSEC) 40 MG capsule Take 1 capsule (40 mg total) by mouth daily. 03/23/23   Toney Reil, MD  Prenatal Vit-Fe Fumarate-FA (PRENATAL PO) Take by mouth.    [provider]  VITAMIN D PO Take by mouth.    [provider]    Physical Exam: Vitals:   03/24/23 0050 03/24/23 0452 03/24/23 0500 03/24/23 0557  BP: (!) 158/128 127/62 (!) 134/94 114/83   Pulse: (!) 102 (!) 52  (!) 52  Resp: 20 10 (!) 24 16  Temp: 98.9 F (37.2 C) 98.8 F (37.1 C)  98.6 F (37 C)  TempSrc: Oral Oral    SpO2: 96% 98% 99% 98%  Weight:      Height:       Physical Exam Constitutional:      Appearance: She is obese.  HENT:     Head: Normocephalic and atraumatic.     Nose: Nose normal.  Eyes:     Extraocular Movements: Extraocular movements intact.     Pupils: Pupils are equal, round, and reactive to light.  Cardiovascular:     Rate and Rhythm: Normal rate and regular rhythm.  Pulmonary:     Effort: Pulmonary effort is normal.  Abdominal:     Comments: Obese abdomen + RUQ and epigastric TTP    Musculoskeletal:     Cervical back: Normal range of motion.  Skin:    General: Skin is warm.  Neurological:     General: No focal deficit present.  Psychiatric:        Mood and Affect: Mood normal.     Data Reviewed:  There are no new results to review at this time.  Assessment and Plan: BMI 45.0-49.9, adult (HCC) BMI 46.8  Major confounder   Abdominal pain Cholelithiasis Gallstone pancreatitis  Noted worsening RUQ and epigastric abdominal TTP over multiple weeks  Initially evaluated by Dr. Allegra Lai in the outpatient setting  RUQ u/s w/ noted cholelithiasis, sonographic murphy sign and borderline dilated common bile duct  T bili 2.4 AST 300/ALT404  Lipase 4000 Case preliminarily discussed w/ Dr. Servando Snare  Will plan for MRCP to assess for possible choledocholithiasis. If negative, will likely need surgical consultation for cholecystectomy  IVF hydration  Pain control  Antiemetics  Follow closely      Advance Care Planning:   Code Status: Full Code   Consults: None at present- GI vs. General surgery depending on MRCP results   Family Communication: No family at the bedside   Severity of Illness: The appropriate patient status for this patient is INPATIENT. Inpatient status is judged to be reasonable and necessary in order to provide  the required intensity of service to ensure the patient's safety. The patient's presenting symptoms, physical exam findings, and initial radiographic and laboratory data in the context of their  chronic comorbidities is felt to place them at high risk for further clinical deterioration. Furthermore, it is not anticipated that the patient will be medically stable for discharge from the hospital within 2 midnights of admission.   * I certify that at the point of admission it is my clinical judgment that the patient will require inpatient hospital care spanning beyond 2 midnights from the point of admission due to high intensity of service, high risk for further deterioration and high frequency of surveillance required.*  Author: Floydene Flock, MD 03/24/2023 8:09 AM  For on call review [email or web address].

## 2023-03-24 NOTE — Consult Note (Signed)
Patient ID: Katherine Whitaker, female   DOB: 07/18/1995, 28 y.o.   MRN: [id number]  HPI Katherine Whitaker is a 28 y.o. female seen in consultation at the request of Dr Alvester Morin. Intermittent pain for the last 6 months, moderate intermittent, sharp, radiation to the back. She reports over the last weeks has been really bad. Associated N/V WBC 15, hgb 14, plt 415, Cr 0.76, AST 300, ALT 404, t bili 2.4. Lipase 4227. RUQ u/s with Cholelithiasis without wall thickening. Positive sonographic  MRCP pers reviewed showing GS, no cholecystitis and no choledocho.  Her BMI is 46.8  Delivered vaginally 6 months ago. Grandmother hx of cholelithiasis responded well to chole.  HPI  Past Medical History:  Diagnosis Date   [redacted] weeks gestation of pregnancy 09/09/2022   Labor and delivery, indication for care 09/09/2022   Obesity affecting pregnancy in third trimester 07/18/2022   BMI 47, growth scans in third trimester, anesthesia consult 4/2  [redacted]w[redacted]d: EFW 40.8 %ile. AFI is 14.82 cm.   [redacted]w[redacted]d: EFW 55.3%ile, AFI 15     PCOS (polycystic ovarian syndrome)    Supervision of other normal pregnancy, antepartum 01/23/2022              Clinical Staff    Provider      Office Location     [facility]    Dating     L and 9wk [place]       Language     English    Anatomy US     normal      Flu Vaccine     offer    Genetic Screen     NIPS: negative, xy      TDaP vaccine      offer    Hgb A1C or   GTT    Early :  Third trimester : 130      Covid    UTD          LAB RESULTS       Rhogam     06/20/2022     Blood Type    A/Negat    Past Surgical History:  Procedure Laterality Date   KNEE SURGERY  2011    Family History  Problem Relation Age of Onset   Healthy Mother    Healthy Father    Seizures Sister    Healthy Brother    Diabetes Maternal Grandmother    Alzheimer's disease Maternal Grandmother    Bipolar disorder Maternal Grandfather    Melanoma Paternal Grandmother    Melanoma Paternal Grandfather    Heart  disease Paternal Grandfather    Polycystic ovary syndrome Other        Paternal First Cousin   Alzheimer's disease Other        MGGM    Social History Social History   Tobacco Use   Smoking status: Never   Smokeless tobacco: Never  Vaping Use   Vaping status: Never Used  Substance Use Topics   Alcohol use: Not Currently    Comment: Less than 1 week   Drug use: No    No Known Allergies  Current Facility-Administered Medications  Medication Dose Route Frequency Provider Last Rate Last Admin   0.9 %  sodium chloride infusion   Intravenous Continuous Floydene Flock, MD 150 mL/hr at 03/24/23 1708 Infusion Verify at 03/24/23 1708   acetaminophen (OFIRMEV) IV 1,000 mg  1,000 mg Intravenous Q6H Gauge Winski F, MD 400 mL/hr at 03/24/23 1835 1,000  mg at 03/24/23 1835   albumin human 25 % solution 25 g  25 g Intravenous Once Danilo Cappiello F, MD       cefTRIAXone (ROCEPHIN) 2 g in sodium chloride 0.9 % 100 mL IVPB  2 g Intravenous Q24H Airyanna Dipalma F, MD 200 mL/hr at 03/24/23 1823 2 g at 03/24/23 1823   hydrALAZINE (APRESOLINE) injection 5 mg  5 mg Intravenous Q4H PRN Andris Baumann, MD       HYDROmorphone (DILAUDID) injection 0.5 mg  0.5 mg Intravenous Q3H PRN Floydene Flock, MD   0.5 mg at 03/24/23 1823   [START ON 03/25/2023] indocyanine green (IC-GREEN) injection 1.25 mg  1.25 mg Intravenous Once Rakisha Pincock F, MD       ketorolac (TORADOL) 30 MG/ML injection 30 mg  30 mg Intravenous Q6H Winston Misner F, MD   30 mg at 03/24/23 1835   pantoprazole (PROTONIX) injection 40 mg  40 mg Intravenous Q12H Floydene Flock, MD   40 mg at 03/24/23 1057     Review of Systems Full ROS  was asked and was negative except for the information on the HPI  Physical Exam Blood pressure 121/73, pulse (!) 57, temperature 98.2 F (36.8 C), temperature source Oral, resp. rate 18, height 5\' 2"  (1.575 m), weight 116.1 kg, SpO2 100%, currently breastfeeding. CONSTITUTIONAL: NAD. EYES: Pupils are equal,  round, Sclera are non-icteric. EARS, NOSE, MOUTH AND THROAT: The oropharynx is clear. The oral mucosa is pink and moist. Hearing is intact to voice. LYMPH NODES:  Lymph nodes in the neck are normal. RESPIRATORY:  Lungs are clear. There is normal respiratory effort, with equal breath sounds bilaterally, and without pathologic use of accessory muscles. CARDIOVASCULAR: Heart is regular without murmurs, gallops, or rubs. GI: The abdomen is  soft, TTP Epigastric area w/o peritonitis. There are no palpable masses. There is no hepatosplenomegaly.  GU: Rectal deferred.   MUSCULOSKELETAL: Normal muscle strength and tone. No cyanosis or edema.   SKIN: Turgor is good and there are no pathologic skin lesions or ulcers. NEUROLOGIC: Motor and sensation is grossly normal. Cranial nerves are grossly intact. PSYCH:  Oriented to person, place and time. Affect is normal.  Data Reviewed  I have personally reviewed the patient's imaging, laboratory findings and medical records.    Assessment/Plan.   Gallstone pancreatitis. D/W the pt in detail about her disease process. We wil recommend NPO. Serial lipase. Hopefully we can proceed w cholecystectomy tomorrow pending clinical condition. We will order repeats labs and lipase in am The risks, benefits, complications, treatment options, and expected outcomes were discussed with the patient. The possibilities of bleeding, recurrent infection, finding a normal gallbladder, perforation of viscus organs, damage to surrounding structures, bile leak, abscess formation, needing a drain placed, the need for additional procedures, reaction to medication, pulmonary aspiration,  failure to diagnose a condition, the possible need to convert to an open procedure, and creating a complication requiring transfusion or operation were discussed with the patient. The patient and/or family concurred with the proposed plan, giving informed consent.     Sterling Big, MD FACS General  Surgeon 03/24/2023, 6:41 PM

## 2023-03-24 NOTE — ED Provider Notes (Signed)
[facility] Provider Note    Event Date/Time   First MD Initiated Contact with Patient 03/24/23 [phone]     (approximate)   History   Emesis   HPI  Katherine Whitaker is a 28 y.o. female who presents to the ED from home with a 1 day history of right upper quadrant abdominal pain, nausea and vomiting.  Similar symptoms for the past 2 weeks, exacerbated by food.  Denies associated fever/chills, chest pain, shortness of breath, dysuria or diarrhea.     Past Medical History   Past Medical History:  Diagnosis Date   [redacted] weeks gestation of pregnancy 09/09/2022   Labor and delivery, indication for care 09/09/2022   Obesity affecting pregnancy in third trimester 07/18/2022   BMI 47, growth scans in third trimester, anesthesia consult 4/2  [redacted]w[redacted]d: EFW 40.8 %ile. AFI is 14.82 cm.   [redacted]w[redacted]d: EFW 55.3%ile, AFI 15     PCOS (polycystic ovarian syndrome)    Supervision of other normal pregnancy, antepartum 01/23/2022              Clinical Staff    Provider      Office Location     [facility]    Dating     L and 9wk [place]       Language     English    Anatomy US     normal      Flu Vaccine     offer    Genetic Screen     NIPS: negative, xy      TDaP vaccine      offer    Hgb A1C or   GTT    Early :  Third trimester : 130      Covid    UTD          LAB RESULTS       Rhogam     06/20/2022     Blood Type    A/Negat     Active Problem List   Patient Active Problem List   Diagnosis Date Noted   Choledocholithiasis 03/24/2023   Rh negative state in antepartum period 07/18/2022   Tendinitis 07/26/2021   Plantar fasciitis of right foot 07/26/2021   Tightness of right gastrocnemius muscle 07/26/2021   Pain in right foot 07/25/2021     Past Surgical History   Past Surgical History:  Procedure Laterality Date   KNEE SURGERY  2011     Home Medications   Prior to Admission medications   Medication Sig Start Date End Date Taking? Authorizing Provider  acetaminophen  (TYLENOL) 325 MG tablet Take 2 tablets (650 mg total) by mouth every 4 (four) hours as needed (for pain scale < 4). 09/13/22   Free, Lindalou Hose, CNM  Cyanocobalamin (B-12 PO) Take by mouth.    [provider]  DIGESTIVE ENZYMES PO Take by mouth.    [provider]  ibuprofen (ADVIL) 600 MG tablet Take 1 tablet (600 mg total) by mouth every 6 (six) hours. 09/13/22   Free, Lindalou Hose, CNM  omeprazole (PRILOSEC) 40 MG capsule Take 1 capsule (40 mg total) by mouth daily. 03/23/23   Toney Reil, MD  Prenatal Vit-Fe Fumarate-FA (PRENATAL PO) Take by mouth.    [provider]  VITAMIN D PO Take by mouth.    [provider]     Allergies  Patient has no known allergies.   Family History   Family History  Problem Relation  Age of Onset   Healthy Mother    Healthy Father    Seizures Sister    Healthy Brother    Diabetes Maternal Grandmother    Alzheimer's disease Maternal Grandmother    Bipolar disorder Maternal Grandfather    Melanoma Paternal Grandmother    Melanoma Paternal Grandfather    Heart disease Paternal Grandfather    Polycystic ovary syndrome Other        Paternal First Cousin   Alzheimer's disease Other        MGGM     Physical Exam  Triage Vital Signs: ED Triage Vitals  Encounter Vitals Group     BP 03/24/23 0050 (!) 158/128     Systolic BP Percentile --      Diastolic BP Percentile --      Pulse Rate 03/24/23 0050 (!) 102     Resp 03/24/23 0050 20     Temp 03/24/23 0050 98.9 F (37.2 C)     Temp Source 03/24/23 0050 Oral     SpO2 03/24/23 0050 96 %     Weight 03/24/23 0049 256 lb (116.1 kg)     Height 03/24/23 0049 5\' 2"  (1.575 m)     Head Circumference --      Peak Flow --      Pain Score 03/24/23 0049 8     Pain Loc --      Pain Education --      Exclude from Growth Chart --     Updated Vital Signs: BP 114/83 (BP Location: Right Arm)   Pulse (!) 52   Temp 98.6 F (37 C)   Resp 16   Ht 5\' 2"  (1.575 m)   Wt 116.1  kg   SpO2 98%   BMI 46.82 kg/m    General: Awake, moderate distress.  CV:  RRR.  Good peripheral perfusion.  Resp:  Normal effort.  CTAB. Abd:  Moderate tenderness to palpation right upper quadrant without rebound or guarding.  No distention.  Other:  No truncal vesicles.   ED Results / Procedures / Treatments  Labs (all labs ordered are listed, but only abnormal results are displayed) Labs Reviewed  LIPASE, BLOOD - Abnormal; Notable for the following components:      Result Value   Lipase 4,227 (*)    All other components within normal limits  COMPREHENSIVE METABOLIC PANEL - Abnormal; Notable for the following components:   CO2 21 (*)    Glucose, Bld 129 (*)    AST 300 (*)    ALT 404 (*)    Alkaline Phosphatase 159 (*)    Total Bilirubin 2.4 (*)    All other components within normal limits  CBC - Abnormal; Notable for the following components:   WBC 15.2 (*)    RBC 5.31 (*)    Platelets 415 (*)    All other components within normal limits  URINALYSIS, ROUTINE W REFLEX MICROSCOPIC  POC URINE PREG, ED     EKG  None   RADIOLOGY I have independently visualized and interpreted patient's imaging study as well as noted the radiology interpretation:  RUQ ultrasound: Cholelithiasis without wall thickening, positive sonographic Murphy sign, HIDA scan may be helpful, CBD upper limits of normal  Official radiology report(s): US ABDOMEN LIMITED RUQ (LIVER/GB) Result Date: 03/24/2023 CLINICAL DATA:  Abnormal LFTs EXAM: ULTRASOUND ABDOMEN LIMITED RIGHT UPPER QUADRANT COMPARISON:  None Available. FINDINGS: Gallbladder: Gallbladder is well distended. Dependent gallstones are noted. No wall thickening or pericholecystic fluid is noted.  Positive sonographic Murphy's sign is elicited. Common bile duct: Diameter: 6.8 mm Liver: No focal lesion identified. Within normal limits in parenchymal echogenicity. Portal vein is patent on color Doppler imaging with normal direction of blood flow  towards the liver. Other: None. IMPRESSION: Cholelithiasis without wall thickening. Positive sonographic Murphy's sign is elicited. HIDA scan may be helpful for further clarification. Upper limits of normal common bile duct. Electronically Signed   By: Alcide Clever M.D.   On: 03/24/2023 02:59     PROCEDURES:  Critical Care performed: Yes CRITICAL CARE Performed by: Irean Hong   Total critical care time: 30 minutes  Critical care time was exclusive of separately billable procedures and treating other patients.  Critical care was necessary to treat or prevent imminent or life-threatening deterioration.  Critical care was time spent personally by me on the following activities: development of treatment plan with patient and/or surrogate as well as nursing, discussions with consultants, evaluation of patient's response to treatment, examination of patient, obtaining history from patient or surrogate, ordering and performing treatments and interventions, ordering and review of laboratory studies, ordering and review of radiographic studies, pulse oximetry and re-evaluation of patient's condition.   Marland Kitchen1-3 Lead EKG Interpretation  Performed by: Irean Hong, MD Authorized by: Irean Hong, MD     Interpretation: normal     ECG rate:  90   ECG rate assessment: normal     Rhythm: sinus rhythm     Ectopy: none     Conduction: normal   Comments:     Patient placed on cardiac monitor to evaluate for arrhythmias    MEDICATIONS ORDERED IN ED: Medications  HYDROmorphone (DILAUDID) injection 0.5 mg (0.5 mg Intravenous Given 03/24/23 0616)  ondansetron (ZOFRAN) injection 4 mg (has no administration in time range)  hydrALAZINE (APRESOLINE) injection 5 mg (has no administration in time range)  0.9 %  sodium chloride infusion ( Intravenous New Bag/Given 03/24/23 0610)  ondansetron (ZOFRAN-ODT) disintegrating tablet 4 mg (4 mg Oral Given 03/24/23 0052)  ondansetron (ZOFRAN) injection 4 mg (4 mg  Intravenous Given 03/24/23 0438)  HYDROmorphone (DILAUDID) injection 0.5 mg (0.5 mg Intravenous Given 03/24/23 0438)  famotidine (PEPCID) IVPB 20 mg premix (0 mg Intravenous Stopped 03/24/23 0525)     IMPRESSION / MDM / ASSESSMENT AND PLAN / ED COURSE  I reviewed the triage vital signs and the nursing notes.                             28 year old female presenting with upper quadrant abdominal pain and emesis. Differential diagnosis includes, but is not limited to, biliary disease (biliary colic, acute cholecystitis, cholangitis, choledocholithiasis, etc), intrathoracic causes for epigastric abdominal pain including ACS, gastritis, duodenitis, pancreatitis, small bowel or large bowel obstruction, abdominal aortic aneurysm, hernia, and ulcer(s).  I personally reviewed patient's records and note patient was seen by Dr. Allegra Lai in the GI office just yesterday for similar and ultrasound scheduled for 03/25/2023.  Patient's presentation is most consistent with acute complicated illness / injury requiring diagnostic workup.  The patient is on the cardiac monitor to evaluate for evidence of arrhythmia and/or significant heart rate changes.  Laboratory results demonstrate moderate leukocytosis with WBC 15.2, abnormal LFTs with transaminases in the 300-4 100s, T. bili 2.4, lipase greater than 4000.  Ultrasound demonstrates cholelithiasis, positive Murphy sign, CBD upper limits of normal.  Initiate IV fluid hydration, IV Dilaudid for pain, Zofran for nausea, add IV Pepcid.  Will discuss case with GI regarding recommendations for MRCP.  Clinical Course as of 03/24/23 0618  Tue Mar 24, 2023  0417 Discussed case with GI on-call Dr. Allegra Lai who does not recommend MRCP.  Will consult hospitalist services for evaluation and admission. [JS]    Clinical Course User Index [JS] Irean Hong, MD     FINAL CLINICAL IMPRESSION(S) / ED DIAGNOSES   Final diagnoses:  Nausea and vomiting, unspecified vomiting type   Right upper quadrant abdominal pain  Acute gallstone pancreatitis     Rx / DC Orders   ED Discharge Orders     None        Note:  This document was prepared using Dragon voice recognition software and may include unintentional dictation errors.   Irean Hong, MD 03/24/23 [phone]

## 2023-03-24 NOTE — ED Triage Notes (Signed)
Pt reports n/v that began earlier tonight with abd pain. Pt denies cough congestion and fever.

## 2023-03-25 ENCOUNTER — Inpatient Hospital Stay: Payer: No Typology Code available for payment source | Admitting: Anesthesiology

## 2023-03-25 ENCOUNTER — Ambulatory Visit: Payer: No Typology Code available for payment source

## 2023-03-25 ENCOUNTER — Other Ambulatory Visit: Payer: Self-pay

## 2023-03-25 ENCOUNTER — Encounter: Payer: Self-pay | Admitting: Family Medicine

## 2023-03-25 ENCOUNTER — Encounter
Admission: EM | Disposition: A | Discharge status: Home / Self Care | Payer: Self-pay | Source: Home / Self Care | Attending: Internal Medicine

## 2023-03-25 DIAGNOSIS — K8000 Calculus of gallbladder with acute cholecystitis without obstruction: Secondary | ICD-10-CM

## 2023-03-25 DIAGNOSIS — K8050 Calculus of bile duct without cholangitis or cholecystitis without obstruction: Secondary | ICD-10-CM

## 2023-03-25 DIAGNOSIS — K810 Acute cholecystitis: Secondary | ICD-10-CM | POA: Diagnosis not present

## 2023-03-25 LAB — URINALYSIS, ROUTINE W REFLEX MICROSCOPIC
Bilirubin Urine: NEGATIVE
Glucose, UA: NEGATIVE mg/dL
Hgb urine dipstick: NEGATIVE
Ketones, ur: 80 mg/dL — AB
Leukocytes,Ua: NEGATIVE
Nitrite: NEGATIVE
Protein, ur: 30 mg/dL — AB
Specific Gravity, Urine: 1.043 — ABNORMAL HIGH (ref 1.005–1.030)
pH: 5 (ref 5.0–8.0)

## 2023-03-25 LAB — LIPASE, BLOOD: Lipase: 314 U/L — ABNORMAL HIGH (ref 11–51)

## 2023-03-25 LAB — POCT PREGNANCY, URINE: Preg Test, Ur: NEGATIVE

## 2023-03-25 SURGERY — CHOLECYSTECTOMY, ROBOT-ASSISTED, LAPAROSCOPIC
Anesthesia: General | Site: Abdomen

## 2023-03-25 MED ORDER — LACTATED RINGERS IV SOLN: INTRAVENOUS | Status: DC

## 2023-03-25 MED ORDER — PROPOFOL 10 MG/ML IV BOLUS
INTRAVENOUS | Status: DC | PRN
Start: 2023-03-25 — End: 2023-03-25
  Administered 2023-03-25: 50 ug/kg/min via INTRAVENOUS
  Administered 2023-03-25: 200 mg via INTRAVENOUS

## 2023-03-25 MED ORDER — MIDAZOLAM HCL 2 MG/2ML IJ SOLN
INTRAMUSCULAR | Status: DC | PRN
  Administered 2023-03-25: 2 mg via INTRAVENOUS

## 2023-03-25 MED ORDER — OXYCODONE HCL 5 MG PO TABS
ORAL_TABLET | ORAL | Status: AC
  Filled 2023-03-25: qty 1

## 2023-03-25 MED ORDER — ONDANSETRON HCL 4 MG/2ML IJ SOLN
INTRAMUSCULAR | Status: DC | PRN
  Administered 2023-03-25: 4 mg via INTRAVENOUS

## 2023-03-25 MED ORDER — BUPIVACAINE-EPINEPHRINE (PF) 0.25% -1:200000 IJ SOLN
INTRAMUSCULAR | Status: DC | PRN
  Administered 2023-03-25: 30 mL

## 2023-03-25 MED ORDER — OXYCODONE HCL 5 MG PO TABS
5.0000 mg | ORAL_TABLET | Freq: Once | ORAL | Status: AC | PRN
  Administered 2023-03-25: 5 mg via ORAL

## 2023-03-25 MED ORDER — OXYCODONE HCL 5 MG/5ML PO SOLN: 5.0000 mg | Freq: Once | ORAL | Status: AC | PRN

## 2023-03-25 MED ORDER — ACETAMINOPHEN 10 MG/ML IV SOLN: 1000.0000 mg | Freq: Once | INTRAVENOUS | Status: DC | PRN

## 2023-03-25 MED ORDER — FENTANYL CITRATE (PF) 100 MCG/2ML IJ SOLN
INTRAMUSCULAR | Status: DC | PRN
Start: 2023-03-25 — End: 2023-03-25
  Administered 2023-03-25 (×2): 50 ug via INTRAVENOUS

## 2023-03-25 MED ORDER — BUPIVACAINE LIPOSOME 1.3 % IJ SUSP
INTRAMUSCULAR | Status: DC | PRN
  Administered 2023-03-25: 20 mL

## 2023-03-25 MED ORDER — FENTANYL CITRATE (PF) 100 MCG/2ML IJ SOLN
INTRAMUSCULAR | Status: AC
  Filled 2023-03-25: qty 2

## 2023-03-25 MED ORDER — ONDANSETRON HCL 4 MG/2ML IJ SOLN: 4.0000 mg | Freq: Once | INTRAMUSCULAR | Status: DC | PRN

## 2023-03-25 MED ORDER — SODIUM CHLORIDE 0.9 % IV SOLN: INTRAVENOUS | Status: AC

## 2023-03-25 MED ORDER — MIDAZOLAM HCL 2 MG/2ML IJ SOLN
INTRAMUSCULAR | Status: AC
  Filled 2023-03-25: qty 2

## 2023-03-25 MED ORDER — BUPIVACAINE-EPINEPHRINE (PF) 0.25% -1:200000 IJ SOLN
INTRAMUSCULAR | Status: AC
  Filled 2023-03-25: qty 30

## 2023-03-25 MED ORDER — CEFAZOLIN SODIUM-DEXTROSE 2-3 GM-%(50ML) IV SOLR
INTRAVENOUS | Status: DC | PRN
  Administered 2023-03-25: 3 g via INTRAVENOUS

## 2023-03-25 MED ORDER — ROCURONIUM BROMIDE 10 MG/ML (PF) SYRINGE
PREFILLED_SYRINGE | INTRAVENOUS | Status: DC | PRN
Start: 2023-03-25 — End: 2023-03-25
  Administered 2023-03-25: 70 mg via INTRAVENOUS

## 2023-03-25 MED ORDER — DEXAMETHASONE SODIUM PHOSPHATE 10 MG/ML IJ SOLN
INTRAMUSCULAR | Status: DC | PRN
  Administered 2023-03-25: 10 mg via INTRAVENOUS

## 2023-03-25 MED ORDER — FENTANYL CITRATE (PF) 100 MCG/2ML IJ SOLN
25.0000 ug | INTRAMUSCULAR | Status: DC | PRN
  Administered 2023-03-25 (×2): 50 ug via INTRAVENOUS

## 2023-03-25 MED ORDER — LIDOCAINE HCL (CARDIAC) PF 100 MG/5ML IV SOSY
PREFILLED_SYRINGE | INTRAVENOUS | Status: DC | PRN
  Administered 2023-03-25: 100 mg via INTRAVENOUS

## 2023-03-25 MED ORDER — SUGAMMADEX SODIUM 200 MG/2ML IV SOLN
INTRAVENOUS | Status: DC | PRN
  Administered 2023-03-25: 200 mg via INTRAVENOUS

## 2023-03-25 MED ORDER — SCOPOLAMINE 1 MG/3DAYS TD PT72
1.0000 | MEDICATED_PATCH | TRANSDERMAL | Status: DC
  Administered 2023-03-25: 1.5 mg via TRANSDERMAL

## 2023-03-25 MED ORDER — SCOPOLAMINE 1 MG/3DAYS TD PT72
MEDICATED_PATCH | TRANSDERMAL | Status: AC
  Filled 2023-03-25: qty 1

## 2023-03-25 MED ORDER — BUPIVACAINE LIPOSOME 1.3 % IJ SUSP
INTRAMUSCULAR | Status: AC
  Filled 2023-03-25: qty 20

## 2023-03-25 MED ORDER — FENTANYL CITRATE (PF) 100 MCG/2ML IJ SOLN
INTRAMUSCULAR | Status: AC
Start: 2023-03-25 — End: ?
  Filled 2023-03-25: qty 2

## 2023-03-25 MED ORDER — LIDOCAINE HCL (PF) 2 % IJ SOLN
INTRAMUSCULAR | Status: AC
  Filled 2023-03-25: qty 5

## 2023-03-25 MED ORDER — PROPOFOL 1000 MG/100ML IV EMUL
INTRAVENOUS | Status: AC
  Filled 2023-03-25: qty 100

## 2023-03-25 SURGICAL SUPPLY — 42 items
CANNULA REDUCER 12-8 DVNC XI (CANNULA) ×1 IMPLANT
CATH REDDICK CHOLANGI 4FR 50CM (CATHETERS) IMPLANT
CAUTERY HOOK MNPLR 1.6 DVNC XI (INSTRUMENTS) ×1 IMPLANT
CLIP LIGATING HEMO O LOK GREEN (MISCELLANEOUS) ×1 IMPLANT
DERMABOND ADVANCED .7 DNX12 (GAUZE/BANDAGES/DRESSINGS) ×1 IMPLANT
DRAPE ARM DVNC X/XI (DISPOSABLE) ×4 IMPLANT
DRAPE COLUMN DVNC XI (DISPOSABLE) ×1 IMPLANT
ELECT REM PT RETURN 9FT ADLT (ELECTROSURGICAL) ×1 IMPLANT
ELECTRODE REM PT RTRN 9FT ADLT (ELECTROSURGICAL) ×1 IMPLANT
FORCEPS BPLR R/ABLATION 8 DVNC (INSTRUMENTS) ×1 IMPLANT
FORCEPS PROGRASP DVNC XI (FORCEP) ×1 IMPLANT
GLOVE BIO SURGEON STRL SZ7 (GLOVE) ×2 IMPLANT
GOWN STRL REUS W/ TWL LRG LVL3 (GOWN DISPOSABLE) ×4 IMPLANT
IRRIGATION STRYKERFLOW (MISCELLANEOUS) IMPLANT
IRRIGATOR STRYKERFLOW (MISCELLANEOUS) IMPLANT
IV CATH ANGIO 12GX3 LT BLUE (NEEDLE) IMPLANT
KIT PINK PAD W/HEAD ARE REST (MISCELLANEOUS) ×1 IMPLANT
KIT PINK PAD W/HEAD ARM REST (MISCELLANEOUS) ×1 IMPLANT
LABEL OR SOLS (LABEL) ×1 IMPLANT
MANIFOLD NEPTUNE II (INSTRUMENTS) ×1 IMPLANT
NDL HYPO 22X1.5 SAFETY MO (MISCELLANEOUS) ×1 IMPLANT
NEEDLE HYPO 22X1.5 SAFETY MO (MISCELLANEOUS) ×1 IMPLANT
NS IRRIG 500ML POUR BTL (IV SOLUTION) ×1 IMPLANT
OBTURATOR OPTICAL STND 8 DVNC (TROCAR) ×1 IMPLANT
OBTURATOR OPTICALSTD 8 DVNC (TROCAR) ×1 IMPLANT
PACK LAP CHOLECYSTECTOMY (MISCELLANEOUS) ×1 IMPLANT
SEAL UNIV 5-12 XI (MISCELLANEOUS) ×4 IMPLANT
SET TUBE SMOKE EVAC HIGH FLOW (TUBING) ×1 IMPLANT
SOL ELECTROSURG ANTI STICK (MISCELLANEOUS) ×1 IMPLANT
SOLUTION ELECTROSURG ANTI STCK (MISCELLANEOUS) ×1 IMPLANT
SPIKE FLUID TRANSFER (MISCELLANEOUS) ×1 IMPLANT
SPONGE T-LAP 18X18 ~~LOC~~+RFID (SPONGE) ×1 IMPLANT
SPONGE T-LAP 4X18 ~~LOC~~+RFID (SPONGE) IMPLANT
STOPCOCK 3WAY MALE LL (IV SETS) IMPLANT
SUT MNCRL AB 4-0 PS2 18 (SUTURE) ×1 IMPLANT
SUT VICRYL 0 UR6 27IN ABS (SUTURE) ×2 IMPLANT
SYR 20ML LL LF (SYRINGE) IMPLANT
SYS BAG RETRIEVAL 10MM (BASKET) ×1 IMPLANT
SYSTEM BAG RETRIEVAL 10MM (BASKET) ×1 IMPLANT
TRAP FLUID SMOKE EVACUATOR (MISCELLANEOUS) ×1 IMPLANT
WATER STERILE IRR 3000ML UROMA (IV SOLUTION) IMPLANT
WATER STERILE IRR 500ML POUR (IV SOLUTION) ×1 IMPLANT

## 2023-03-25 NOTE — Discharge Instructions (Signed)
In addition to included general post-operative instructions,  Diet: Resume home diet. Recommend avoiding or limiting fatty/greasy foods over the next few days/week. If you do eat these, you may (or may not) notice diarrhea. This is expected while your body adjusts to not having a gallbladder, and it typically resolves with time.    Activity: No heavy lifting >20 pounds (children, pets, laundry, garbage) or strenuous activity for 4 weeks, but light activity and walking are encouraged. Do not drive or drink alcohol if taking narcotic pain medications or having pain that might distract from driving.  Wound care: On 02/20, you may shower/get incision wet with soapy water and pat dry (do not rub incisions), but no baths or submerging incision underwater until follow-up.   Medications: Resume all home medications. For mild to moderate pain: acetaminophen (Tylenol) or ibuprofen/naproxen (if no kidney disease). Combining Tylenol with alcohol can substantially increase your risk of causing liver disease. Narcotic pain medications, if prescribed, can be used for severe pain, though may cause nausea, constipation, and drowsiness. Do not combine Tylenol and Percocet (or similar) within a 6 hour period as Percocet (and similar) contain(s) Tylenol. If you do not need the narcotic pain medication, you do not need to fill the prescription.  Call office [phone] / [phone]) at any time if any questions, worsening pain, fevers/chills, bleeding, drainage from incision site, or other concerns. \

## 2023-03-25 NOTE — Progress Notes (Signed)
Patient returns from PACU, lap sites x 4 CDI, patient alert and oriented. VSS

## 2023-03-25 NOTE — Transfer of Care (Signed)
Immediate Anesthesia Transfer of Care Note  Patient: Katherine Whitaker  Procedure(s) Performed: XI ROBOTIC ASSISTED LAPAROSCOPIC CHOLECYSTECTOMY (Abdomen) INDOCYANINE GREEN FLUORESCENCE IMAGING (ICG) (Abdomen)  Patient Location: PACU  Anesthesia Type:General  Level of Consciousness: drowsy and patient cooperative  Airway & Oxygen Therapy: Patient Spontanous Breathing and Patient connected to nasal cannula oxygen  Post-op Assessment: Report given to RN and Post -op Vital signs reviewed and stable  Post vital signs: Reviewed and stable  Last Vitals:  Vitals Value Taken Time  BP 117/70 03/25/23 1315  Temp 36.4 C 03/25/23 1312  Pulse 81 03/25/23 1316  Resp 21 03/25/23 1316  SpO2 100 % 03/25/23 1316  Vitals shown include unfiled device data.  Last Pain:  Vitals:   03/25/23 1023  TempSrc: Temporal  PainSc: 0-No pain      Patients Stated Pain Goal: 0 (03/25/23 1023)  Complications: There were no known notable events for this encounter.

## 2023-03-25 NOTE — Plan of Care (Signed)

## 2023-03-25 NOTE — Plan of Care (Signed)
   Problem: Education: Goal: Knowledge of General Education information will improve Description Including pain rating scale, medication(s)/side effects and non-pharmacologic comfort measures Outcome: Progressing

## 2023-03-25 NOTE — Op Note (Signed)
Robotic assisted laparoscopic Cholecystectomy  Pre-operative Diagnosis: gallstone pancreatitis and acute cholecystitis  Post-operative Diagnosis: same  Procedure:  Robotic assisted laparoscopic Cholecystectomy  Surgeon: Sterling Big, MD FACS  Anesthesia: Gen. with endotracheal tube  Findings: Acute Cholecystitis   Estimated Blood Loss: 10 cc       Specimens: Gallbladder           Complications: none   Procedure Details  The patient was seen again in the Holding Room. The benefits, complications, treatment options, and expected outcomes were discussed with the patient. The risks of bleeding, infection, recurrence of symptoms, failure to resolve symptoms, bile duct damage, bile duct leak, retained common bile duct stone, bowel injury, any of which could require further surgery and/or ERCP, stent, or papillotomy were reviewed with the patient. The likelihood of improving the patient's symptoms with return to their baseline status is good.  The patient and/or family concurred with the proposed plan, giving informed consent.  The patient was taken to Operating Room, identified  and the procedure verified as Laparoscopic Cholecystectomy.  A Time Out was held and the above information confirmed.  Prior to the induction of general anesthesia, antibiotic prophylaxis was administered. VTE prophylaxis was in place. General endotracheal anesthesia was then administered and tolerated well. After the induction, the abdomen was prepped with Chloraprep and draped in the sterile fashion. The patient was positioned in the supine position.  Cut down technique was used to enter the abdominal cavity, please note that due to her generous body habitus, we had significant challenges reaching the fascia. We spent considerable time dissecting the subcutaneous, we used additional deeper instruments  until we were finally abe to identified the fascia, this was elevated and a Hasson trochar was placed after two  vicryl stitches were anchored to the fascia. Pneumoperitoneum was then created with CO2 and tolerated well without any adverse changes in the patient's vital signs.  Three 8-mm ports were placed under direct vision. All skin incisions  were infiltrated with a local anesthetic agent before making the incision and placing the trocars.    The patient was positioned  in reverse Trendelenburg, robot was brought to the surgical field and docked in the standard fashion.  We made sure all the instrumentation was kept indirect view at all times and that there were no collision between the arms. I scrubbed out and went to the console.  The gallbladder was identified, found inflamed and the liver was very large due to hepatic steatosis, the fundus grasped and retracted cephalad. Adhesions were lysed bluntly. The infundibulum was grasped and retracted laterally, exposing the peritoneum overlying the triangle of Calot. This was then divided and exposed in a blunt fashion. An extended critical view of the cystic duct and cystic artery was obtained.  The cystic duct was clearly identified and bluntly dissected.   Artery and duct were double clipped and divided. Using ICG cholangiography we visualize the cystic duct and  CBD w/o evidence of bile injuries. The gallbladder was taken from the gallbladder fossa in a retrograde fashion with the electrocautery.  Hemostasis was achieved with the electrocautery. nspection of the right upper quadrant was performed. No bleeding, bile duct injury or leak, or bowel injury was noted. Robotic instruments and robotic arms were undocked in the standard fashion.  I scrubbed back in.  The gallbladder was removed and placed in an Endocatch bag.   Pneumoperitoneum was released.  The periumbilical port site was closed with interrumpted 0 Vicryl sutures. 4-0 subcuticular Monocryl was  used to close the skin. Dermabond was  applied.  The patient was then extubated and brought to the recovery  room in stable condition. Sponge, lap, and needle counts were correct at closure and at the conclusion of the case.  Please note that this case required additional effort and time,   we had significant challenges reaching the fascia. We spent significant time dissecting the subcutaneous tissue , intra-abdominally the size of the liver made dissection more time consuming.      Sterling Big, MD, FACS

## 2023-03-25 NOTE — Anesthesia Postprocedure Evaluation (Signed)
Anesthesia Post Note  Patient: Katherine Whitaker  Procedure(s) Performed: XI ROBOTIC ASSISTED LAPAROSCOPIC CHOLECYSTECTOMY (Abdomen) INDOCYANINE GREEN FLUORESCENCE IMAGING (ICG) (Abdomen)  Patient location during evaluation: PACU Anesthesia Type: General Level of consciousness: awake and alert, oriented and patient cooperative Pain management: pain level controlled Vital Signs Assessment: post-procedure vital signs reviewed and stable Respiratory status: spontaneous breathing, nonlabored ventilation and respiratory function stable Cardiovascular status: blood pressure returned to baseline and stable Postop Assessment: adequate PO intake Anesthetic complications: no   There were no known notable events for this encounter.   Last Vitals:  Vitals:   03/25/23 1335 03/25/23 1345  BP:  119/82  Pulse: 73 81  Resp: 14 17  Temp:    SpO2: 97% 97%    Last Pain:  Vitals:   03/25/23 1345  TempSrc:   PainSc: Asleep                 Reed Breech

## 2023-03-25 NOTE — Anesthesia Preprocedure Evaluation (Addendum)
Anesthesia Evaluation  Patient identified by MRN, date of birth, ID band Patient awake    Reviewed: Allergy & Precautions, NPO status , Patient's Chart, lab work & pertinent test results  History of Anesthesia Complications (+) PONV and history of anesthetic complications  Airway Mallampati: I   Neck ROM: Full    Dental no notable dental hx.    Pulmonary neg pulmonary ROS   Pulmonary exam normal breath sounds clear to auscultation       Cardiovascular Exercise Tolerance: Good negative cardio ROS Normal cardiovascular exam Rhythm:Regular Rate:Normal     Neuro/Psych negative neurological ROS     GI/Hepatic ,GERD  ,,  Endo/Other    Class 3 obesityPCOS  Renal/GU negative Renal ROS     Musculoskeletal   Abdominal   Peds  Hematology negative hematology ROS (+)   Anesthesia Other Findings   Reproductive/Obstetrics                             Anesthesia Physical Anesthesia Plan  ASA: 3  Anesthesia Plan: General   Post-op Pain Management:    Induction: Intravenous  PONV Risk Score and Plan: 4 or greater and Ondansetron, Dexamethasone, Treatment may vary due to age or medical condition, Propofol infusion, TIVA and Scopolamine patch - Pre-op  Airway Management Planned: Oral ETT  Additional Equipment:   Intra-op Plan:   Post-operative Plan: Extubation in OR  Informed Consent: I have reviewed the patients History and Physical, chart, labs and discussed the procedure including the risks, benefits and alternatives for the proposed anesthesia with the patient or authorized representative who has indicated his/her understanding and acceptance.     Dental advisory given  Plan Discussed with: CRNA  Anesthesia Plan Comments: (Patient consented for risks of anesthesia including but not limited to:  - adverse reactions to medications - damage to eyes, teeth, lips or other oral mucosa -  nerve damage due to positioning  - sore throat or hoarseness - damage to heart, brain, nerves, lungs, other parts of body or loss of life  Informed patient about role of CRNA in peri- and intra-operative care.  Patient voiced understanding.)       Anesthesia Quick Evaluation

## 2023-03-25 NOTE — Progress Notes (Signed)
PROGRESS NOTE    Katherine Whitaker  ZOX:[id number] DOB: 28-Jun-1995 DOA: 03/24/2023 PCP: Berniece Salines, FNP    Brief Narrative:  28 y.o. female with medical history significant of obesity presenting w/  abdominal pain, gallstone pancreatitis. Pt reports recurring abdominal pain since delivering her child last year. Symptoms fairly episodic. Pain was predominantly in upper abdomen and RUQ. Pain has progressively worsened over the past several weeks. + worsening nausea. Only able to tolerate liquids for the most part for several days.  No CP or SOB. No fevers. Abd pain has had some intermittent radiation to the back. No diarrhea. Was referred for evaluation to GI yesterday for symptoms with Dr. Allegra Lai. Recommendations were for RUQ u/s. Pt had acute decompensation in pain overnight.  Presented to ER afebrile, hemodynamically stable. Satting well on RA. WBC 15, hgb 14, plt 415, Cr 0.76, AST 300, ALT 404, t bili 2.4. Lipase 4227. RUQ u/s with Cholelithiasis without wall thickening. Positive sonographic Murphy's sign. Upper limits of normal common bile duct   2/19: Pancreatitis essentially resolved.  Lipase near normal.  Laparoscopic cholecystectomy planned today.   Assessment & Plan:   Principal Problem:   Choledocholithiasis Active Problems:   Abdominal pain   BMI 45.0-49.9, adult (HCC)   Cholecystitis   Acute gallstone pancreatitis   Abdominal pain Cholelithiasis Gallstone pancreatitis  Noted worsening RUQ and epigastric abdominal TTP over multiple weeks  Initially evaluated by Dr. Allegra Lai in the outpatient setting  RUQ u/s w/ noted cholelithiasis, sonographic murphy sign and borderline dilated common bile duct  T bili 2.4 AST 300/ALT404  Lipase 4000 GI did not recommend MRCP Plan: N.p.o. for laparoscopic cholecystectomy today.  Continue IV hydration and pain control for now.  Continue as needed antiemetics.  Diet advance slowly postoperatively.  Class III obesity BMI 46.8.   Complicating factor in overall care and prognosis  DVT prophylaxis: On hold Code Status: Full Family Communication: Spouse at bedside 2/19 Disposition Plan: Status is: Inpatient Remains inpatient appropriate because: Gallstone pancreatitis.  Laparoscopic cholecystectomy today.  Possible discharge in 1 to 2 days.   Level of care: Med-Surg  Consultants:  General surgery  Procedures:  Laparoscopic cholecystectomy  Antimicrobials: Rocephin   Subjective: Seen and examined.  Resting in bed.  Reports improvement in abdominal pain.  No other complaints.  Objective: Vitals:   03/24/23 0810 03/24/23 2315 03/25/23 0808 03/25/23 1023  BP: 121/73 (!) 106/57 (!) 92/59 99/64  Pulse: (!) 57 (!) 57 75 80  Resp: 18 18 16 16   Temp: 98.2 F (36.8 C) 98.3 F (36.8 C) 98.1 F (36.7 C) 98.1 F (36.7 C)  TempSrc: Oral  Oral Temporal  SpO2: 100% 99% 100% 100%  Weight:    116.1 kg  Height:    5\' 2"  (1.575 m)    Intake/Output Summary (Last 24 hours) at 03/25/2023 1259 Last data filed at 03/25/2023 1253 Gross per 24 hour  Intake 2833.99 ml  Output --  Net 2833.99 ml   Filed Weights   03/24/23 0049 03/25/23 1023  Weight: 116.1 kg 116.1 kg    Examination:  General exam: Appears calm and comfortable  Respiratory system: Clear to auscultation. Respiratory effort normal. Cardiovascular system: S1-S2, RRR, no murmurs, no pedal edema Gastrointestinal system: Obese, soft, mild tender to palpation epigastrium, normal bowel sounds Central nervous system: Alert and oriented. No focal neurological deficits. Extremities: Symmetric 5 x 5 power. Skin: No rashes, lesions or ulcers Psychiatry: Judgement and insight appear normal. Mood & affect appropriate.  Data Reviewed: I have personally reviewed following labs and imaging studies  CBC: Recent Labs  Lab 03/24/23 0051  WBC 15.2*  HGB 14.4  HCT 44.3  MCV 83.4  PLT 415*   Basic Metabolic Panel: Recent Labs  Lab 03/24/23 0051  NA  136  K 3.7  CL 102  CO2 21*  GLUCOSE 129*  BUN 9  CREATININE 0.76  CALCIUM 9.2   GFR: Estimated Creatinine Clearance: 126.4 mL/min (by C-G formula based on SCr of 0.76 mg/dL). Liver Function Tests: Recent Labs  Lab 03/24/23 0051  AST 300*  ALT 404*  ALKPHOS 159*  BILITOT 2.4*  PROT 8.1  ALBUMIN 4.3   Recent Labs  Lab 03/24/23 0051 03/25/23 0544  LIPASE 4,227* 314*   No results for input(s): "AMMONIA" in the last 168 hours. Coagulation Profile: No results for input(s): "INR", "PROTIME" in the last 168 hours. Cardiac Enzymes: No results for input(s): "CKTOTAL", "CKMB", "CKMBINDEX", "TROPONINI" in the last 168 hours. BNP (last 3 results) No results for input(s): "PROBNP" in the last 8760 hours. HbA1C: No results for input(s): "HGBA1C" in the last 72 hours. CBG: No results for input(s): "GLUCAP" in the last 168 hours. Lipid Profile: No results for input(s): "CHOL", "HDL", "LDLCALC", "TRIG", "CHOLHDL", "LDLDIRECT" in the last 72 hours. Thyroid Function Tests: No results for input(s): "TSH", "T4TOTAL", "FREET4", "T3FREE", "THYROIDAB" in the last 72 hours. Anemia Panel: No results for input(s): "VITAMINB12", "FOLATE", "FERRITIN", "TIBC", "IRON", "RETICCTPCT" in the last 72 hours. Sepsis Labs: No results for input(s): "PROCALCITON", "LATICACIDVEN" in the last 168 hours.  No results found for this or any previous visit (from the past 240 hours).       Radiology Studies: MR ABDOMEN MRCP W WO CONTAST Result Date: 03/24/2023 CLINICAL DATA:  RIGHT upper quadrant pain and nausea with vomiting. EXAM: MRI ABDOMEN WITHOUT AND WITH CONTRAST (INCLUDING MRCP) TECHNIQUE: Multiplanar multisequence MR imaging of the abdomen was performed both before and after the administration of intravenous contrast. Heavily T2-weighted images of the biliary and pancreatic ducts were obtained, and three-dimensional MRCP images were rendered by post processing. CONTRAST:  10mL GADAVIST GADOBUTROL 1  MMOL/ML IV SOLN COMPARISON:  None Available. FINDINGS: Lower chest:  Lung bases are clear. Hepatobiliary: Several small subcentimeter gallstones collect within the neck and fundus of the gallbladder (image 21/3 and image 25/3). Common bile duct is normal caliber at 5 mm. No stones are evident the common bile duct on the heavily weighted MRCP sequence (series 14). No intrahepatic duct dilatation. No hepatic steatosis identified on opposed phase imaging within liver parenchyma. Pancreas: Normal pancreatic parenchymal intensity. No ductal dilatation or inflammation. Spleen: Normal spleen. Adrenals/urinary tract: Adrenal glands and kidneys are normal. Stomach/Bowel: Stomach and limited of the small bowel is unremarkable Vascular/Lymphatic: Abdominal aortic normal caliber. No retroperitoneal periportal lymphadenopathy. Musculoskeletal: No aggressive osseous lesion IMPRESSION: 1. Cholelithiasis without evidence of acute cholecystitis. 2. Normal caliber common bile duct.  No choledocholithiasis. 3. Normal pancreas. Electronically Signed   By: Genevive Bi M.D.   On: 03/24/2023 14:13   MR 3D Recon At Scanner Result Date: 03/24/2023 CLINICAL DATA:  RIGHT upper quadrant pain and nausea with vomiting. EXAM: MRI ABDOMEN WITHOUT AND WITH CONTRAST (INCLUDING MRCP) TECHNIQUE: Multiplanar multisequence MR imaging of the abdomen was performed both before and after the administration of intravenous contrast. Heavily T2-weighted images of the biliary and pancreatic ducts were obtained, and three-dimensional MRCP images were rendered by post processing. CONTRAST:  10mL GADAVIST GADOBUTROL 1 MMOL/ML IV SOLN COMPARISON:  None  Available. FINDINGS: Lower chest:  Lung bases are clear. Hepatobiliary: Several small subcentimeter gallstones collect within the neck and fundus of the gallbladder (image 21/3 and image 25/3). Common bile duct is normal caliber at 5 mm. No stones are evident the common bile duct on the heavily weighted MRCP  sequence (series 14). No intrahepatic duct dilatation. No hepatic steatosis identified on opposed phase imaging within liver parenchyma. Pancreas: Normal pancreatic parenchymal intensity. No ductal dilatation or inflammation. Spleen: Normal spleen. Adrenals/urinary tract: Adrenal glands and kidneys are normal. Stomach/Bowel: Stomach and limited of the small bowel is unremarkable Vascular/Lymphatic: Abdominal aortic normal caliber. No retroperitoneal periportal lymphadenopathy. Musculoskeletal: No aggressive osseous lesion IMPRESSION: 1. Cholelithiasis without evidence of acute cholecystitis. 2. Normal caliber common bile duct.  No choledocholithiasis. 3. Normal pancreas. Electronically Signed   By: Genevive Bi M.D.   On: 03/24/2023 14:13   US ABDOMEN LIMITED RUQ (LIVER/GB) Result Date: 03/24/2023 CLINICAL DATA:  Abnormal LFTs EXAM: ULTRASOUND ABDOMEN LIMITED RIGHT UPPER QUADRANT COMPARISON:  None Available. FINDINGS: Gallbladder: Gallbladder is well distended. Dependent gallstones are noted. No wall thickening or pericholecystic fluid is noted. Positive sonographic Murphy's sign is elicited. Common bile duct: Diameter: 6.8 mm Liver: No focal lesion identified. Within normal limits in parenchymal echogenicity. Portal vein is patent on color Doppler imaging with normal direction of blood flow towards the liver. Other: None. IMPRESSION: Cholelithiasis without wall thickening. Positive sonographic Murphy's sign is elicited. HIDA scan may be helpful for further clarification. Upper limits of normal common bile duct. Electronically Signed   By: Alcide Clever M.D.   On: 03/24/2023 02:59        Scheduled Meds:  [MAR Hold] ketorolac  30 mg Intravenous Q6H   [MAR Hold] pantoprazole (PROTONIX) IV  40 mg Intravenous Q12H   scopolamine  1 patch Transdermal Q72H   Continuous Infusions:  sodium chloride     [MAR Hold] cefTRIAXone (ROCEPHIN)  IV 2 g (03/24/23 1823)     LOS: 1 day .  Tresa Moore,  MD [facility]   If 7PM-7AM, please contact night-coverage  03/25/2023, 12:59 PM

## 2023-03-25 NOTE — Progress Notes (Signed)
[facility] SURGICAL PROGRESS NOTE  Hospital Day(s): 1.   Interval History:  Patient seen and examined No acute events or new complaints overnight.  Patient reports she is feeling better Abdominal pain resolving; now minimally sore; no need for analgesic overnight No fever, chills, nausea, emesis  Lipase 314 (from 4,000) She is NPO Plan for robotic assisted laparoscopic cholecystectomy today  Vital signs in last 24 hours: [min-max] current  Temp:  [98.2 F (36.8 C)-98.3 F (36.8 C)] 98.3 F (36.8 C) (02/18 2315) Pulse Rate:  [57] 57 (02/18 2315) Resp:  [18] 18 (02/18 2315) BP: (106-121)/(57-73) 106/57 (02/18 2315) SpO2:  [99 %-100 %] 99 % (02/18 2315)     Height: 5\' 2"  (157.5 cm) Weight: 116.1 kg BMI (Calculated): 46.81   Intake/Output last 2 shifts:  02/18 0701 - 02/19 0700 In: 2734 [I.V.:2434; IV Piggyback:300] Out: -    Physical Exam:  Constitutional: alert, cooperative and no distress  Respiratory: breathing non-labored at rest  Cardiovascular: regular rate and sinus rhythm  Gastrointestinal: soft, minimal soreness to epigastrium, markedly improved, and non-distended Integumentary: warm, dry  Labs:     Latest Ref Rng & Units 03/24/2023   12:51 AM 09/16/2022   12:13 AM 09/12/2022   10:52 AM  CBC  WBC 4.0 - 10.5 K/uL 15.2  12.9  19.1   Hemoglobin 12.0 - 15.0 g/dL 16.1  09.6  04.5   Hematocrit 36.0 - 46.0 % 44.3  39.0  33.6   Platelets 150 - 400 K/uL 415  270  183       Latest Ref Rng & Units 03/24/2023   12:51 AM 09/16/2022   12:13 AM 07/15/2021    8:45 AM  CMP  Glucose 70 - 99 mg/dL 409  811  85   BUN 6 - 20 mg/dL 9  11  9    Creatinine 0.44 - 1.00 mg/dL 9.14  7.82  9.56   Sodium 135 - 145 mmol/L 136  138  141   Potassium 3.5 - 5.1 mmol/L 3.7  3.8  4.7   Chloride 98 - 111 mmol/L 102  105  103   CO2 22 - 32 mmol/L 21  22  22    Calcium 8.9 - 10.3 mg/dL 9.2  8.7  9.5   Total Protein 6.5 - 8.1 g/dL 8.1  7.2  6.6   Total Bilirubin 0.0 - 1.2 mg/dL  2.4  0.6  0.2   Alkaline Phos 38 - 126 U/L 159  91  99   AST 15 - 41 U/L 300  34  20   ALT 0 - 44 U/L 404  31  33      Imaging studies: No new pertinent imaging studies   Assessment/Plan:  28 y.o. female with clinically improving gallstone pancreatitis   - Plan for robotic assisted laparoscopic cholecystectomy this afternoon with Dr Everlene Farrier pending Anesthesia/OR availability    - All risks, benefits, and alternatives to above procedure(s) were discussed with the patient, all of her questions were answered to her expressed satisfaction, patient expresses she wishes to proceed, and informed consent was obtained.    - NPO for planned procedure  - IV Abx (Rocephin)  - ICG on call to OR  - Monitor abdominal examination; on-going bowel function - Pain control prn; antiemetics prn - Monitor lipase; improved - Mobilize - Further management per primary service; we will follow    All of the above findings and recommendations were discussed with the patient, and the medical team, and  all of patient's questions were answered to her expressed satisfaction.  -- Lynden Oxford, PA-C [facility] 03/25/2023, 7:20 AM M-F: 7am - 4pm

## 2023-03-25 NOTE — Progress Notes (Signed)
Nurse wasted first dose of IV dilaudid as dropped on floor as nurse was preparing to administer. Wasted medication with nurse Elona and administered another dose.

## 2023-03-25 NOTE — Anesthesia Procedure Notes (Signed)
Procedure Name: Intubation Date/Time: 03/25/2023 11:44 AM  Performed by: Lysbeth Penner, CRNAPre-anesthesia Checklist: Patient identified, Emergency Drugs available, Suction available and Patient being monitored Patient Re-evaluated:Patient Re-evaluated prior to induction Oxygen Delivery Method: Circle system utilized Preoxygenation: Pre-oxygenation with 100% oxygen Induction Type: IV induction Ventilation: Mask ventilation without difficulty Laryngoscope Size: McGrath and 3 Grade View: Grade I Tube type: Oral Tube size: 6.5 mm Number of attempts: 1 Airway Equipment and Method: Stylet and Oral airway Placement Confirmation: ETT inserted through vocal cords under direct vision, positive ETCO2 and breath sounds checked- equal and bilateral Secured at: 21 cm Tube secured with: Tape Dental Injury: Teeth and Oropharynx as per pre-operative assessment

## 2023-03-26 DIAGNOSIS — K8050 Calculus of bile duct without cholangitis or cholecystitis without obstruction: Secondary | ICD-10-CM | POA: Diagnosis not present

## 2023-03-26 LAB — CBC
HCT: 37.1 % (ref 36.0–46.0)
Hemoglobin: 12 g/dL (ref 12.0–15.0)
MCH: 27.1 pg (ref 26.0–34.0)
MCHC: 32.3 g/dL (ref 30.0–36.0)
MCV: 83.9 fL (ref 80.0–100.0)
Platelets: 282 10*3/uL (ref 150–400)
RBC: 4.42 MIL/uL (ref 3.87–5.11)
RDW: 13.8 % (ref 11.5–15.5)
WBC: 16.5 10*3/uL — ABNORMAL HIGH (ref 4.0–10.5)
nRBC: 0 % (ref 0.0–0.2)

## 2023-03-26 MED ORDER — OXYCODONE HCL 5 MG PO TABS: 5.0000 mg | ORAL_TABLET | Freq: Three times a day (TID) | ORAL | 0 refills | Status: DC | PRN

## 2023-03-26 NOTE — Discharge Summary (Signed)
Physician Discharge Summary  Katherine Whitaker MWU:[id number] DOB: 08-04-1995 DOA: 03/24/2023  PCP: Berniece Salines, FNP  Admit date: 03/24/2023 Discharge date: 03/26/2023  Admitted From: Home Disposition:  Home  Recommendations for Outpatient Follow-up:  Follow up with PCP in 1-2 weeks   Home Health:No Equipment/Devices:None   Discharge Condition:Stable  CODE STATUS:FULL  Diet recommendation: Reg  Brief/Interim Summary:  28 y.o. female with medical history significant of obesity presenting w/  abdominal pain, gallstone pancreatitis. Pt reports recurring abdominal pain since delivering her child last year. Symptoms fairly episodic. Pain was predominantly in upper abdomen and RUQ. Pain has progressively worsened over the past several weeks. + worsening nausea. Only able to tolerate liquids for the most part for several days.  No CP or SOB. No fevers. Abd pain has had some intermittent radiation to the back. No diarrhea. Was referred for evaluation to GI yesterday for symptoms with Dr. Allegra Lai. Recommendations were for RUQ u/s. Pt had acute decompensation in pain overnight.  Presented to ER afebrile, hemodynamically stable. Satting well on RA. WBC 15, hgb 14, plt 415, Cr 0.76, AST 300, ALT 404, t bili 2.4. Lipase 4227. RUQ u/s with Cholelithiasis without wall thickening. Positive sonographic Murphy's sign. Upper limits of normal common bile duct    2/19: Pancreatitis essentially resolved.  Lipase near normal.  Laparoscopic cholecystectomy planned today.   2/20: POD#1.  Doing well, pain controlled, tolerating PO.  Stable for dc.    Discharge Diagnoses:  Principal Problem:   Choledocholithiasis Active Problems:   Abdominal pain   BMI 45.0-49.9, adult (HCC)   Cholecystitis   Acute gallstone pancreatitis   Calculus of gallbladder with acute cholecystitis without obstruction  Abdominal pain Cholelithiasis Gallstone pancreatitis  Noted worsening RUQ and epigastric abdominal TTP  over multiple weeks  Initially evaluated by Dr. Allegra Lai in the outpatient setting  RUQ u/s w/ noted cholelithiasis, sonographic murphy sign and borderline dilated common bile duct  T bili 2.4 AST 300/ALT404  Lipase 4000 GI did not recommend MRCP Plan: S/p lap chole.  Stable post op, pain controlled, tolerating PO.  Stable for dc.   Class III obesity BMI 46.8.  Complicating factor in overall care and prognosis   Discharge Instructions  Discharge Instructions     Call MD for:  difficulty breathing, headache or visual disturbances   Complete by: As directed    Call MD for:  extreme fatigue   Complete by: As directed    Call MD for:  hives   Complete by: As directed    Call MD for:  persistant dizziness or light-headedness   Complete by: As directed    Call MD for:  persistant nausea and vomiting   Complete by: As directed    Call MD for:  redness, tenderness, or signs of infection (pain, swelling, redness, odor or green/yellow discharge around incision site)   Complete by: As directed    Call MD for:  severe uncontrolled pain   Complete by: As directed    Call MD for:  temperature >100.4   Complete by: As directed    Diet - low sodium heart healthy   Complete by: As directed    Diet - low sodium heart healthy   Complete by: As directed    Discharge instructions   Complete by: As directed    Shower starting tomorrow   Increase activity slowly   Complete by: As directed    Increase activity slowly   Complete by: As directed    Lifting  restrictions   Complete by: As directed    20 lbs x 6 wks      Allergies as of 03/26/2023   No Known Allergies      Medication List     TAKE these medications    acetaminophen 325 MG tablet Commonly known as: Tylenol Take 2 tablets (650 mg total) by mouth every 4 (four) hours as needed (for pain scale < 4).   B-12 PO Take by mouth.   DIGESTIVE ENZYMES PO Take by mouth.   ibuprofen 600 MG tablet Commonly known as:  ADVIL Take 1 tablet (600 mg total) by mouth every 6 (six) hours.   omeprazole 40 MG capsule Commonly known as: PRILOSEC Take 1 capsule (40 mg total) by mouth daily.   oxyCODONE 5 MG immediate release tablet Commonly known as: Roxicodone Take 1 tablet (5 mg total) by mouth every 8 (eight) hours as needed for severe pain (pain score 7-10) or breakthrough pain.   PRENATAL PO Take by mouth.   VITAMIN D PO Take by mouth.        Follow-up Information     Donovan Kail, PA-C. Schedule an appointment as soon as possible for a visit in 2 week(s).   Specialty: Physician Assistant Why: s/p laparoscopic cholecystectomy;  Office is closed today due to weather; office will call patient to sch appt Contact information: [address] [place] [address] [phone]                No Known Allergies  Consultations: General surgery   Procedures/Studies: MR ABDOMEN MRCP W WO CONTAST Result Date: 03/24/2023 CLINICAL DATA:  RIGHT upper quadrant pain and nausea with vomiting. EXAM: MRI ABDOMEN WITHOUT AND WITH CONTRAST (INCLUDING MRCP) TECHNIQUE: Multiplanar multisequence MR imaging of the abdomen was performed both before and after the administration of intravenous contrast. Heavily T2-weighted images of the biliary and pancreatic ducts were obtained, and three-dimensional MRCP images were rendered by post processing. CONTRAST:  10mL GADAVIST GADOBUTROL 1 MMOL/ML IV SOLN COMPARISON:  None Available. FINDINGS: Lower chest:  Lung bases are clear. Hepatobiliary: Several small subcentimeter gallstones collect within the neck and fundus of the gallbladder (image 21/3 and image 25/3). Common bile duct is normal caliber at 5 mm. No stones are evident the common bile duct on the heavily weighted MRCP sequence (series 14). No intrahepatic duct dilatation. No hepatic steatosis identified on opposed phase imaging within liver parenchyma. Pancreas: Normal pancreatic parenchymal  intensity. No ductal dilatation or inflammation. Spleen: Normal spleen. Adrenals/urinary tract: Adrenal glands and kidneys are normal. Stomach/Bowel: Stomach and limited of the small bowel is unremarkable Vascular/Lymphatic: Abdominal aortic normal caliber. No retroperitoneal periportal lymphadenopathy. Musculoskeletal: No aggressive osseous lesion IMPRESSION: 1. Cholelithiasis without evidence of acute cholecystitis. 2. Normal caliber common bile duct.  No choledocholithiasis. 3. Normal pancreas. Electronically Signed   By: Genevive Bi M.D.   On: 03/24/2023 14:13   MR 3D Recon At Scanner Result Date: 03/24/2023 CLINICAL DATA:  RIGHT upper quadrant pain and nausea with vomiting. EXAM: MRI ABDOMEN WITHOUT AND WITH CONTRAST (INCLUDING MRCP) TECHNIQUE: Multiplanar multisequence MR imaging of the abdomen was performed both before and after the administration of intravenous contrast. Heavily T2-weighted images of the biliary and pancreatic ducts were obtained, and three-dimensional MRCP images were rendered by post processing. CONTRAST:  10mL GADAVIST GADOBUTROL 1 MMOL/ML IV SOLN COMPARISON:  None Available. FINDINGS: Lower chest:  Lung bases are clear. Hepatobiliary: Several small subcentimeter gallstones collect within the neck and fundus of the  gallbladder (image 21/3 and image 25/3). Common bile duct is normal caliber at 5 mm. No stones are evident the common bile duct on the heavily weighted MRCP sequence (series 14). No intrahepatic duct dilatation. No hepatic steatosis identified on opposed phase imaging within liver parenchyma. Pancreas: Normal pancreatic parenchymal intensity. No ductal dilatation or inflammation. Spleen: Normal spleen. Adrenals/urinary tract: Adrenal glands and kidneys are normal. Stomach/Bowel: Stomach and limited of the small bowel is unremarkable Vascular/Lymphatic: Abdominal aortic normal caliber. No retroperitoneal periportal lymphadenopathy. Musculoskeletal: No aggressive osseous  lesion IMPRESSION: 1. Cholelithiasis without evidence of acute cholecystitis. 2. Normal caliber common bile duct.  No choledocholithiasis. 3. Normal pancreas. Electronically Signed   By: Genevive Bi M.D.   On: 03/24/2023 14:13   US ABDOMEN LIMITED RUQ (LIVER/GB) Result Date: 03/24/2023 CLINICAL DATA:  Abnormal LFTs EXAM: ULTRASOUND ABDOMEN LIMITED RIGHT UPPER QUADRANT COMPARISON:  None Available. FINDINGS: Gallbladder: Gallbladder is well distended. Dependent gallstones are noted. No wall thickening or pericholecystic fluid is noted. Positive sonographic Murphy's sign is elicited. Common bile duct: Diameter: 6.8 mm Liver: No focal lesion identified. Within normal limits in parenchymal echogenicity. Portal vein is patent on color Doppler imaging with normal direction of blood flow towards the liver. Other: None. IMPRESSION: Cholelithiasis without wall thickening. Positive sonographic Murphy's sign is elicited. HIDA scan may be helpful for further clarification. Upper limits of normal common bile duct. Electronically Signed   By: Alcide Clever M.D.   On: 03/24/2023 02:59      Subjective: Seen and examined on day of discharge.  Stable, no distress.  Appropriate for discharge home.  Discharge Exam: Vitals:   03/26/23 0405 03/26/23 0803  BP: 106/62 106/66  Pulse: (!) 55 60  Resp: 18 18  Temp: 98.1 F (36.7 C) 98.2 F (36.8 C)  SpO2: 99% 100%   Vitals:   03/25/23 1956 03/25/23 2354 03/26/23 0405 03/26/23 0803  BP: 117/63 112/70 106/62 106/66  Pulse: 87 64 (!) 55 60  Resp: 18 18 18 18   Temp: 98.2 F (36.8 C) 98 F (36.7 C) 98.1 F (36.7 C) 98.2 F (36.8 C)  TempSrc: Oral   Oral  SpO2: 95% 98% 99% 100%  Weight:      Height:        General: Pt is alert, awake, not in acute distress Cardiovascular: RRR, S1/S2 +, no rubs, no gallops Respiratory: CTA bilaterally, no wheezing, no rhonchi Abdominal: Soft, NT, ND, bowel sounds + Extremities: no edema, no cyanosis    The results of  significant diagnostics from this hospitalization (including imaging, microbiology, ancillary and laboratory) are listed below for reference.     Microbiology: No results found for this or any previous visit (from the past 240 hours).   Labs: BNP (last 3 results) No results for input(s): "BNP" in the last 8760 hours. Basic Metabolic Panel: Recent Labs  Lab 03/24/23 0051  NA 136  K 3.7  CL 102  CO2 21*  GLUCOSE 129*  BUN 9  CREATININE 0.76  CALCIUM 9.2   Liver Function Tests: Recent Labs  Lab 03/24/23 0051  AST 300*  ALT 404*  ALKPHOS 159*  BILITOT 2.4*  PROT 8.1  ALBUMIN 4.3   Recent Labs  Lab 03/24/23 0051 03/25/23 0544  LIPASE 4,227* 314*   No results for input(s): "AMMONIA" in the last 168 hours. CBC: Recent Labs  Lab 03/24/23 0051 03/26/23 0533  WBC 15.2* 16.5*  HGB 14.4 12.0  HCT 44.3 37.1  MCV 83.4 83.9  PLT 415* 282  Cardiac Enzymes: No results for input(s): "CKTOTAL", "CKMB", "CKMBINDEX", "TROPONINI" in the last 168 hours. BNP: Invalid input(s): "POCBNP" CBG: No results for input(s): "GLUCAP" in the last 168 hours. D-Dimer No results for input(s): "DDIMER" in the last 72 hours. Hgb A1c No results for input(s): "HGBA1C" in the last 72 hours. Lipid Profile No results for input(s): "CHOL", "HDL", "LDLCALC", "TRIG", "CHOLHDL", "LDLDIRECT" in the last 72 hours. Thyroid function studies No results for input(s): "TSH", "T4TOTAL", "T3FREE", "THYROIDAB" in the last 72 hours.  Invalid input(s): "FREET3" Anemia work up No results for input(s): "VITAMINB12", "FOLATE", "FERRITIN", "TIBC", "IRON", "RETICCTPCT" in the last 72 hours. Urinalysis    Component Value Date/Time   COLORURINE AMBER (A) 03/25/2023 0924   APPEARANCEUR HAZY (A) 03/25/2023 0924   APPEARANCEUR Clear 02/27/2022 1038   LABSPEC 1.043 (H) 03/25/2023 0924   PHURINE 5.0 03/25/2023 0924   GLUCOSEU NEGATIVE 03/25/2023 0924   HGBUR NEGATIVE 03/25/2023 0924   BILIRUBINUR NEGATIVE  03/25/2023 0924   BILIRUBINUR Negative 09/04/2022 1029   BILIRUBINUR Negative 02/27/2022 1038   KETONESUR 80 (A) 03/25/2023 0924   PROTEINUR 30 (A) 03/25/2023 0924   UROBILINOGEN 0.2 09/04/2022 1029   NITRITE NEGATIVE 03/25/2023 0924   LEUKOCYTESUR NEGATIVE 03/25/2023 0924   Sepsis Labs Recent Labs  Lab 03/24/23 0051 03/26/23 0533  WBC 15.2* 16.5*   Microbiology No results found for this or any previous visit (from the past 240 hours).   Time coordinating discharge: Over 30 minutes  SIGNED:   Tresa Moore, MD  [facility] 03/26/2023, 1:35 PM Pager   If 7PM-7AM, please contact night-coverage

## 2023-03-26 NOTE — Plan of Care (Signed)
   Problem: Education: Goal: Knowledge of General Education information will improve Description Including pain rating scale, medication(s)/side effects and non-pharmacologic comfort measures Outcome: Progressing

## 2023-03-27 LAB — SURGICAL PATHOLOGY

## 2023-04-08 ENCOUNTER — Encounter: Payer: Self-pay | Admitting: Physician Assistant

## 2023-04-08 ENCOUNTER — Ambulatory Visit (INDEPENDENT_AMBULATORY_CARE_PROVIDER_SITE_OTHER): Payer: No Typology Code available for payment source | Admitting: Physician Assistant

## 2023-04-08 VITALS — BP 121/81 | HR 73 | Temp 98.5°F | Ht 63.0 in | Wt 261.2 lb

## 2023-04-08 DIAGNOSIS — K819 Cholecystitis, unspecified: Secondary | ICD-10-CM

## 2023-04-08 DIAGNOSIS — K810 Acute cholecystitis: Secondary | ICD-10-CM

## 2023-04-08 DIAGNOSIS — K8050 Calculus of bile duct without cholangitis or cholecystitis without obstruction: Secondary | ICD-10-CM

## 2023-04-08 DIAGNOSIS — K851 Biliary acute pancreatitis without necrosis or infection: Secondary | ICD-10-CM

## 2023-04-08 DIAGNOSIS — Z09 Encounter for follow-up examination after completed treatment for conditions other than malignant neoplasm: Secondary | ICD-10-CM

## 2023-04-08 NOTE — Patient Instructions (Signed)

## 2023-04-08 NOTE — Progress Notes (Signed)
 [facility] POST-OP OFFICE VISIT  04/08/2023  HPI: Katherine Whitaker is a 28 y.o. female 14 days s/p robotic assisted laparoscopic cholecystectomy for gallstone pancreatitis with Dr Everlene Farrier  She is overall doing well; feels great No abdominal pain, nausea, emesis, or bowel changes She does have scabbing to her umbilical and right lateral port site No fever, chills Ambulating well No other complaints   Vital signs: BP 121/81   Pulse 73   Temp 98.5 F (36.9 C) (Oral)   Ht 5\' 3"  (1.6 m)   Wt 261 lb 3.2 oz (118.5 kg)   LMP 03/22/2023 (Exact Date) Comment: UPT neg 03/25/23  SpO2 98%   BMI 46.27 kg/m    Physical Exam: Constitutional: Well appearing female, NAD Abdomen: Soft, non-tender, non-distended, no rebound/guarding Skin: Laparoscopic incisions are healing well, there is scabbing and crust to the umbilical and right lateral port sites, no erythema, no active drainage. The remaining laparoscopic incisions are well healed   Assessment/Plan: This is a 28 y.o. female 14 days s/p robotic assisted laparoscopic cholecystectomy for gallstone pancreatitis with Dr Everlene Farrier   - Pain control prn  - Reviewed wound care recommendation; okay to cover umbilical incision as needed  - Reviewed lifting restrictions; 4 weeks total  - Reviewed surgical pathology; CCC  - She can follow up on as needed basis; She understands to call with questions/concerns  -- Lynden Oxford, PA-C [facility] 04/08/2023, 3:48 PM M-F: 7am - 4pm

## 2023-04-13 ENCOUNTER — Encounter: Payer: Self-pay | Admitting: Nurse Practitioner

## 2023-04-13 ENCOUNTER — Ambulatory Visit: Payer: Self-pay | Admitting: Nurse Practitioner

## 2023-04-13 VITALS — BP 118/68 | HR 93 | Resp 18 | Ht 63.0 in | Wt 263.7 lb

## 2023-04-13 DIAGNOSIS — Z6841 Body Mass Index (BMI) 40.0 and over, adult: Secondary | ICD-10-CM

## 2023-04-13 DIAGNOSIS — R748 Abnormal levels of other serum enzymes: Secondary | ICD-10-CM

## 2023-04-13 DIAGNOSIS — E785 Hyperlipidemia, unspecified: Secondary | ICD-10-CM

## 2023-04-13 DIAGNOSIS — D72829 Elevated white blood cell count, unspecified: Secondary | ICD-10-CM

## 2023-04-13 NOTE — Progress Notes (Signed)
 BP 118/68   Pulse 93   Resp 18   Ht 5\' 3"  (1.6 m)   Wt 263 lb 11.2 oz (119.6 kg)   LMP 04/12/2023 (Exact Date) Comment: UPT neg 03/25/23  SpO2 96%   BMI 46.71 kg/m    Subjective:    Patient ID: Katherine Whitaker, female    DOB: Nov 25, 1995, 28 y.o.   MRN: [id number]  HPI: Katherine Whitaker is a 28 y.o. female  Chief Complaint  Patient presents with   Establish Care    Discussed the use of AI scribe software for clinical note transcription with the patient, who gave verbal consent to proceed.  History of Present Illness   She presents to establish care.  She underwent a laparoscopic cholecystectomy recently. Post-surgery, she experienced significant improvement, although one incision is healing slowly. She is managing the wound with cleanliness and care, hoping for complete healing before her upcoming vacation.  She has struggled with weight issues for most of her life but does not have hypertension, cholesterol issues, or diabetes. She finds it challenging to lose weight and has not maintained a consistent diet since before her son was born, attributing this to fatigue during pregnancy and the demands of caring for her first child. She plans to make dietary changes after her vacation.  Her family history includes type 2 diabetes in grandparents on both sides and melanoma in her paternal grandfather and possibly on both sides of her family. She is a Runner, broadcasting/film/video and finds it challenging to maintain a healthy diet due to her busy schedule and responsibilities as a mother. Recent lab work showed normal A1c and thyroid levels, but elevated liver enzymes and lipase levels. No issues with blood pressure, cholesterol, or diabetes.       04/13/2023    9:27 AM 07/22/2021   11:00 AM 07/05/2021   10:30 AM  Depression screen PHQ 2/9  Decreased Interest 0 0 0  Down, Depressed, Hopeless 0 0 1  PHQ - 2 Score 0 0 1  Altered sleeping 0 0 0  Tired, decreased energy 0 0 1  Change in appetite 0  0 1  Feeling bad or failure about yourself  0 0 1  Trouble concentrating 0 0 1  Moving slowly or fidgety/restless 0 0 1  Suicidal thoughts 0 0 0  PHQ-9 Score 0 0 6  Difficult doing work/chores Not difficult at all Not difficult at all Not difficult at all    Relevant past medical, surgical, family and social history reviewed and updated as indicated. Interim medical history since our last visit reviewed. Allergies and medications reviewed and updated.  Review of Systems  Constitutional: Negative for fever or weight change.  Respiratory: Negative for cough and shortness of breath.   Cardiovascular: Negative for chest pain or palpitations.  Gastrointestinal: Negative for abdominal pain, no bowel changes.  Musculoskeletal: Negative for gait problem or joint swelling.  Skin: Negative for rash.  Neurological: Negative for dizziness or headache.  No other specific complaints in a complete review of systems (except as listed in HPI above).      Objective:    BP 118/68   Pulse 93   Resp 18   Ht 5\' 3"  (1.6 m)   Wt 263 lb 11.2 oz (119.6 kg)   LMP 04/12/2023 (Exact Date) Comment: UPT neg 03/25/23  SpO2 96%   BMI 46.71 kg/m    Wt Readings from Last 3 Encounters:  04/13/23 263 lb 11.2 oz (119.6 kg)  04/08/23  261 lb 3.2 oz (118.5 kg)  03/25/23 256 lb (116.1 kg)    Physical Exam Vitals reviewed.  Constitutional:      Appearance: Normal appearance.  HENT:     Head: Normocephalic.  Cardiovascular:     Rate and Rhythm: Normal rate and regular rhythm.  Pulmonary:     Effort: Pulmonary effort is normal.     Breath sounds: Normal breath sounds.  Musculoskeletal:        General: Normal range of motion.  Skin:    General: Skin is warm and dry.  Neurological:     General: No focal deficit present.     Mental Status: She is alert and oriented to person, place, and time. Mental status is at baseline.  Psychiatric:        Mood and Affect: Mood normal.        Behavior: Behavior normal.         Thought Content: Thought content normal.        Judgment: Judgment normal.     Results for orders placed or performed during the hospital encounter of 03/24/23  Lipase, blood   Collection Time: 03/24/23 12:51 AM  Result Value Ref Range   Lipase 4,227 (H) 11 - 51 U/L  Comprehensive metabolic panel   Collection Time: 03/24/23 12:51 AM  Result Value Ref Range   Sodium 136 135 - 145 mmol/L   Potassium 3.7 3.5 - 5.1 mmol/L   Chloride 102 98 - 111 mmol/L   CO2 21 (L) 22 - 32 mmol/L   Glucose, Bld 129 (H) 70 - 99 mg/dL   BUN 9 6 - 20 mg/dL   Creatinine, Ser 2.53 0.44 - 1.00 mg/dL   Calcium 9.2 8.9 - 66.4 mg/dL   Total Protein 8.1 6.5 - 8.1 g/dL   Albumin 4.3 3.5 - 5.0 g/dL   AST 403 (H) 15 - 41 U/L   ALT 404 (H) 0 - 44 U/L   Alkaline Phosphatase 159 (H) 38 - 126 U/L   Total Bilirubin 2.4 (H) 0.0 - 1.2 mg/dL   GFR, Estimated >47 >42 mL/min   Anion gap 13 5 - 15  CBC   Collection Time: 03/24/23 12:51 AM  Result Value Ref Range   WBC 15.2 (H) 4.0 - 10.5 K/uL   RBC 5.31 (H) 3.87 - 5.11 MIL/uL   Hemoglobin 14.4 12.0 - 15.0 g/dL   HCT 59.5 63.8 - 75.6 %   MCV 83.4 80.0 - 100.0 fL   MCH 27.1 26.0 - 34.0 pg   MCHC 32.5 30.0 - 36.0 g/dL   RDW 43.3 29.5 - 18.8 %   Platelets 415 (H) 150 - 400 K/uL   nRBC 0.0 0.0 - 0.2 %  Surgical pathology   Collection Time: 03/25/23 12:00 AM  Result Value Ref Range   SURGICAL PATHOLOGY      SURGICAL PATHOLOGY [facility] [address] [place], [place] [address] Telephone [phone] or [phone] Fax [phone]  REPORT OF SURGICAL PATHOLOGY   Accession #: [id number] Patient Name: Katherine Whitaker Visit # : [id number]  MRN: [id number] Physician: Sterling Big DOB/Age [birth year] (Age: 59) Gender: F Collected Date: 03/25/2023 Received Date: 03/25/2023  FINAL DIAGNOSIS       1. 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      - NEGATIVE FOR  MALIGNANCY.       DATE SIGNED OUT: 03/27/2023 ELECTRONIC SIGNATURE : Rubinas Md,  Delice Bison , Sports administrator, International aid/development worker  MICROSCOPIC DESCRIPTION  CASE COMMENTS STAINS USED IN DIAGNOSIS: H&E    CLINICAL HISTORY  SPECIMEN(S) OBTAINED 1. Gallbladder,  SPECIMEN COMMENTS: SPECIMEN CLINICAL INFORMATION: 1. Cholecystitis    Gross Description 1. Size/?Intact: 9.4 x 4.4 x 4.0 cm, received intact.      Serosal surfa ce: Tan-purple, smooth and glistening.      Mucosa/Wall: The mucosa is tan-green and velvety with diffuse, severe yellow      stippling; the wall is 0.1 cm thick and edematous.      Contents: Numerous bosselated yellow stones (0.3 to 0.4 cm in diameter) and      abundant thin green bile.      Cystic duct: Grossly patent; no lymph node is identified adjacent to the cystic      duct.      Block Summary: The cystic duct margin (en face, inked blue) and representative      cross sections from neck, body, and fundus are submitted in block 1A.      SMB      03/26/23        Report signed out from the following location(s) [place] [address], [place] [address] CLIA #: [id number]  [facility] [address] [place], [place] [address] CLIA #: [id number]   Lipase, blood   Collection Time: 03/25/23  5:44 AM  Result Value Ref Range   Lipase 314 (H) 11 - 51 U/L  Urinalysis, Routine w reflex microscopic -   Collection Time: 03/25/23  9:24 AM  Result Value Ref Range   Color, Urine AMBER (A) YELLOW   APPearance HAZY (A) CLEAR   Specific Gravity, Urine 1.043 (H) 1.005 - 1.030   pH 5.0 5.0 - 8.0   Glucose, UA NEGATIVE NEGATIVE mg/dL   Hgb urine dipstick NEGATIVE NEGATIVE   Bilirubin Urine NEGATIVE NEGATIVE   Ketones, ur 80 (A) NEGATIVE mg/dL   Protein, ur 30 (A) NEGATIVE mg/dL   Nitrite NEGATIVE NEGATIVE   Leukocytes,Ua NEGATIVE NEGATIVE   RBC / HPF 0-5 0 - 5 RBC/hpf   WBC, UA 11-20 0 - 5 WBC/hpf   Bacteria,  UA FEW (A) NONE SEEN   Squamous Epithelial / HPF 11-20 0 - 5 /HPF   Mucus PRESENT   Pregnancy, urine POC   Collection Time: 03/25/23 10:33 AM  Result Value Ref Range   Preg Test, Ur NEGATIVE NEGATIVE  CBC   Collection Time: 03/26/23  5:33 AM  Result Value Ref Range   WBC 16.5 (H) 4.0 - 10.5 K/uL   RBC 4.42 3.87 - 5.11 MIL/uL   Hemoglobin 12.0 12.0 - 15.0 g/dL   HCT 24.4 01.0 - 27.2 %   MCV 83.9 80.0 - 100.0 fL   MCH 27.1 26.0 - 34.0 pg   MCHC 32.3 30.0 - 36.0 g/dL   RDW 53.6 64.4 - 03.4 %   Platelets 282 150 - 400 K/uL   nRBC 0.0 0.0 - 0.2 %       Assessment & Plan:   Problem List Items Addressed This Visit       Other   BMI 45.0-49.9, adult (HCC) - Primary   Relevant Orders   Amb Ref to Medical Weight Management   Other Visit Diagnoses       Hyperlipidemia, unspecified hyperlipidemia type       Relevant Orders   Lipid panel     Elevated liver enzymes  Relevant Orders   COMPLETE METABOLIC PANEL WITH GFR   Lipase     Leukocytosis, unspecified type       Relevant Orders   CBC with Differential/Platelet        Assessment and Plan    Post-cholecystectomy status Mostly healed post-laparoscopic cholecystectomy with one incision slow to heal. No biliary colic or surgical complications. - Monitor incision healing and maintain cleanliness to prevent infection.  Obesity Long-standing obesity without comorbidities. Discussed [facility] for structured weight management support. - Refer to [facility] program for comprehensive weight management support.  General Health Maintenance Normal A1c and thyroid function. Liver enzymes previously elevated, likely due to gallbladder issues. Slightly elevated cholesterol in 2023. - Repeat lipase and liver function tests. - Check cholesterol levels.        Follow up plan: Return in about 6 months (around 10/14/2023) for cpe.

## 2023-04-15 ENCOUNTER — Encounter (INDEPENDENT_AMBULATORY_CARE_PROVIDER_SITE_OTHER): Payer: Self-pay

## 2023-06-18 ENCOUNTER — Ambulatory Visit: Admitting: Nurse Practitioner

## 2023-07-09 NOTE — Progress Notes (Signed)
 "  BP 116/74 (BP Location: Right Arm, Patient Position: Sitting, Cuff Size: Normal)   Pulse 83   Temp 98.5 F (36.9 C) (Oral)   Resp 17   Ht 5' 3 (1.6 m)   Wt 280 lb 8 oz (127.2 kg)   LMP 06/15/2023 (Approximate)   BMI 49.69 kg/m    Subjective:    Patient ID: Katherine Whitaker, female    DOB: Nov 11, 1995, 28 y.o.   MRN: [id number]  HPI: Katherine Whitaker is a 28 y.o. female  Chief Complaint  Patient presents with   Obesity    Wants to discuss starting    lesion    Left breast, noticed 1 month ago has not one away no itching or pain     Discussed the use of AI scribe software for clinical note transcription with the patient, who gave verbal consent to proceed.  History of Present Illness Katherine Whitaker is a 28 year old female who presents for obesity and weight management.  She currently weighs 280 pounds with a BMI of 49.69. She has attempted dietary changes but has not used any medications for weight loss. She is interested in exploring medication options for weight management.  She has a lesion on her left breast that appeared in the last month or two. Initially thought to be related to sweat, it now appears slightly infected. There is no drainage, and it is not resolving despite her usual checks for lumps, which have been negative.           07/10/2023    9:01 AM 04/13/2023    9:27 AM 07/22/2021   11:00 AM  Depression screen PHQ 2/9  Decreased Interest 0 0 0  Down, Depressed, Hopeless 0 0 0  PHQ - 2 Score 0 0 0  Altered sleeping 0 0 0  Tired, decreased energy 0 0 0  Change in appetite 0 0 0  Feeling bad or failure about yourself  0 0 0  Trouble concentrating 0 0 0  Moving slowly or fidgety/restless 0 0 0  Suicidal thoughts 0 0 0  PHQ-9 Score 0 0 0  Difficult doing work/chores Not difficult at all Not difficult at all Not difficult at all    Relevant past medical, surgical, family and social history reviewed and updated as indicated. Interim medical  history since our last visit reviewed. Allergies and medications reviewed and updated.  Review of Systems  Ten systems reviewed and is negative except as mentioned in HPI      Objective:      BP 116/74 (BP Location: Right Arm, Patient Position: Sitting, Cuff Size: Normal)   Pulse 83   Temp 98.5 F (36.9 C) (Oral)   Resp 17   Ht 5' 3 (1.6 m)   Wt 280 lb 8 oz (127.2 kg)   LMP 06/15/2023 (Approximate)   BMI 49.69 kg/m    Wt Readings from Last 3 Encounters:  07/10/23 280 lb 8 oz (127.2 kg)  04/13/23 263 lb 11.2 oz (119.6 kg)  04/08/23 261 lb 3.2 oz (118.5 kg)    Physical Exam Vitals reviewed.  Constitutional:      Appearance: Normal appearance.  HENT:     Head: Normocephalic.  Cardiovascular:     Rate and Rhythm: Normal rate and regular rhythm.  Pulmonary:     Effort: Pulmonary effort is normal.     Breath sounds: Normal breath sounds.  Chest:       Comments: foliculitis Musculoskeletal:  General: Normal range of motion.  Skin:    General: Skin is warm and dry.  Neurological:     General: No focal deficit present.     Mental Status: She is alert and oriented to person, place, and time. Mental status is at baseline.  Psychiatric:        Mood and Affect: Mood normal.        Behavior: Behavior normal.        Thought Content: Thought content normal.        Judgment: Judgment normal.        Results for orders placed or performed during the hospital encounter of 03/24/23  Lipase, blood   Collection Time: 03/24/23 12:51 AM  Result Value Ref Range   Lipase 4,227 (H) 11 - 51 U/L  Comprehensive metabolic panel   Collection Time: 03/24/23 12:51 AM  Result Value Ref Range   Sodium 136 135 - 145 mmol/L   Potassium 3.7 3.5 - 5.1 mmol/L   Chloride 102 98 - 111 mmol/L   CO2 21 (L) 22 - 32 mmol/L   Glucose, Bld 129 (H) 70 - 99 mg/dL   BUN 9 6 - 20 mg/dL   Creatinine, Ser 9.23 0.44 - 1.00 mg/dL   Calcium  9.2 8.9 - 10.3 mg/dL   Total Protein 8.1 6.5 - 8.1  g/dL   Albumin  4.3 3.5 - 5.0 g/dL   AST 699 (H) 15 - 41 U/L   ALT 404 (H) 0 - 44 U/L   Alkaline Phosphatase 159 (H) 38 - 126 U/L   Total Bilirubin 2.4 (H) 0.0 - 1.2 mg/dL   GFR, Estimated >39 >39 mL/min   Anion gap 13 5 - 15  CBC   Collection Time: 03/24/23 12:51 AM  Result Value Ref Range   WBC 15.2 (H) 4.0 - 10.5 K/uL   RBC 5.31 (H) 3.87 - 5.11 MIL/uL   Hemoglobin 14.4 12.0 - 15.0 g/dL   HCT 55.6 63.9 - 53.9 %   MCV 83.4 80.0 - 100.0 fL   MCH 27.1 26.0 - 34.0 pg   MCHC 32.5 30.0 - 36.0 g/dL   RDW 86.1 88.4 - 84.4 %   Platelets 415 (H) 150 - 400 K/uL   nRBC 0.0 0.0 - 0.2 %  Surgical pathology   Collection Time: 03/25/23 12:00 AM  Result Value Ref Range   SURGICAL PATHOLOGY      SURGICAL PATHOLOGY [facility] [address], [address] [place], [place] [address] Telephone [phone] or [phone] Fax [phone]  REPORT OF SURGICAL PATHOLOGY   Accession #: [id number] Patient Name: Katherine, Whitaker Visit # : [id number]  MRN: [id number] Physician: Katherine Whitaker DOB/Age Nov 09, 1995 (Age: 17) Gender: F Collected Date: 03/25/2023 Received Date: 03/25/2023  FINAL DIAGNOSIS       1. 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      - NEGATIVE FOR MALIGNANCY.       DATE SIGNED OUT: 03/27/2023 ELECTRONIC SIGNATURE : Katherine Whitaker , Pathologist, Electronic Signature  MICROSCOPIC DESCRIPTION  CASE COMMENTS STAINS USED IN DIAGNOSIS: H&E    CLINICAL HISTORY  SPECIMEN(S) OBTAINED 1. Gallbladder,  SPECIMEN COMMENTS: SPECIMEN CLINICAL INFORMATION: 1. Cholecystitis    Gross Description 1. Size/?Intact: 9.4 x 4.4 x 4.0 cm, received intact.      Serosal surfa ce: Tan-purple, smooth and glistening.      Mucosa/Wall: The mucosa is tan-green and velvety with diffuse, severe yellow  stippling; the wall is 0.1 cm thick and edematous.      Contents: Numerous bosselated yellow stones (0.3  to 0.4 cm in diameter) and      abundant thin green bile.      Cystic duct: Grossly patent; no lymph node is identified adjacent to the cystic      duct.      Block Summary: The cystic duct margin (en face, inked blue) and representative      cross sections from neck, body, and fundus are submitted in block 1A.      SMB      03/26/23        Report signed out from the following location(s) [place] [address] [place], [place] [address] CLIA #: [id number]  [facility] [address] [place], [place] [address] CLIA #: [id number]   Lipase, blood   Collection Time: 03/25/23  5:44 AM  Result Value Ref Range   Lipase 314 (H) 11 - 51 U/L  Urinalysis, Routine w reflex microscopic -   Collection Time: 03/25/23  9:24 AM  Result Value Ref Range   Color, Urine AMBER (A) YELLOW   APPearance HAZY (A) CLEAR   Specific Gravity, Urine 1.043 (H) 1.005 - 1.030   pH 5.0 5.0 - 8.0   Glucose, UA NEGATIVE NEGATIVE mg/dL   Hgb urine dipstick NEGATIVE NEGATIVE   Bilirubin Urine NEGATIVE NEGATIVE   Ketones, ur 80 (A) NEGATIVE mg/dL   Protein, ur 30 (A) NEGATIVE mg/dL   Nitrite NEGATIVE NEGATIVE   Leukocytes,Ua NEGATIVE NEGATIVE   RBC / HPF 0-5 0 - 5 RBC/hpf   WBC, UA 11-20 0 - 5 WBC/hpf   Bacteria, UA FEW (A) NONE SEEN   Squamous Epithelial / HPF 11-20 0 - 5 /HPF   Mucus PRESENT   Pregnancy, urine POC   Collection Time: 03/25/23 10:33 AM  Result Value Ref Range   Preg Test, Ur NEGATIVE NEGATIVE  CBC   Collection Time: 03/26/23  5:33 AM  Result Value Ref Range   WBC 16.5 (H) 4.0 - 10.5 K/uL   RBC 4.42 3.87 - 5.11 MIL/uL   Hemoglobin 12.0 12.0 - 15.0 g/dL   HCT 62.8 63.9 - 53.9 %   MCV 83.9 80.0 - 100.0 fL   MCH 27.1 26.0 - 34.0 pg   MCHC 32.3 30.0 - 36.0 g/dL   RDW 86.1 88.4 - 84.4 %   Platelets 282 150 - 400 K/uL   nRBC 0.0 0.0 - 0.2 %          Assessment & Plan:   Problem List Items Addressed This Visit       Other   BMI  45.0-49.9, adult (HCC) - Primary   Relevant Medications   tirzepatide  (ZEPBOUND ) 2.5 MG/0.5ML Pen   Other Visit Diagnoses       Folliculitis       Relevant Medications   mupirocin  ointment (BACTROBAN ) 2 %        Assessment and Plan Assessment & Plan Obesity BMI is 49.69, indicating obesity. She has attempted dietary modifications but has not used pharmacotherapy for weight management. She expressed interest in Zepbound  Cloyde), a GLP-1 receptor agonist administered weekly. There is no family history of thyroid  cancer or personal history of recurrent pancreatitis, which is important for GLP-1 use. Discussed the mechanism, potential side effects (nausea, vomiting, abdominal pain, constipation), and the importance of a calorie deficit and physical activity. Discussed consistent administration and potential insurance cost  variability ($25-$750). - Prescribe Zepbound  (Mounjaro) starting at 2.5 mg weekly, with dose escalation as tolerated. - Advise on maintaining a calorie deficit and engaging in regular physical activity. - Recommend consuming 30-40 grams of protein per meal and taking a multivitamin. - Instruct to monitor for abdominal pain and report any occurrences immediately. - Advise to avoid pregnancy while on medication. - Schedule follow-up in 3 months for weight check and documentation of weight loss. - Encourage continuation of lifestyle modifications, including dietary management and regular exercise. -continue to increase physical activity, getting at least 150 min of physical activity a week.  Work on including runner, broadcasting/film/video 2 days a week.  - continue eating at a calorie deficit 1600-1700 cal a day, eating a well balanced diet with whole foods, avoiding processed foods.     folliculitis Lesion on the left breast appeared in the last month or two, likely a superficial infection, possibly folliculitis. It appears slightly infected with no significant drainage. Plan to start  with topical treatment and reassess. - Prescribe mupirocin  ointment for topical application to the lesion. - Advise warm compresses 4-6 times a day to help soften the lesion. - Request follow-up in one week with an update on the lesion's progress.  General Health Maintenance Emphasized dietary control and physical activity for weight management. Advised on portion control, benefits of strength training, and measuring food intake to adjust consumption. - Advise on portion control by measuring food intake to understand current consumption. - Recommend 30 minutes of daily walking and strength training exercises.        Follow up plan: Return in about 3 months (around 10/10/2023) for follow up.      "

## 2023-07-10 ENCOUNTER — Encounter: Payer: Self-pay | Admitting: Nurse Practitioner

## 2023-07-10 ENCOUNTER — Ambulatory Visit: Admitting: Nurse Practitioner

## 2023-07-10 VITALS — BP 116/74 | HR 83 | Temp 98.5°F | Resp 17 | Ht 63.0 in | Wt 280.5 lb

## 2023-07-10 DIAGNOSIS — L739 Follicular disorder, unspecified: Secondary | ICD-10-CM

## 2023-07-10 DIAGNOSIS — Z6841 Body Mass Index (BMI) 40.0 and over, adult: Secondary | ICD-10-CM

## 2023-07-10 DIAGNOSIS — E66813 Obesity, class 3: Secondary | ICD-10-CM | POA: Diagnosis not present

## 2023-07-10 MED ORDER — ZEPBOUND 2.5 MG/0.5ML ~~LOC~~ SOAJ: 2.5000 mg | SUBCUTANEOUS | 0 refills | Status: AC

## 2023-07-10 MED ORDER — MUPIROCIN 2 % EX OINT: TOPICAL_OINTMENT | CUTANEOUS | 0 refills | Status: AC

## 2023-07-13 ENCOUNTER — Other Ambulatory Visit (HOSPITAL_COMMUNITY): Payer: Self-pay

## 2023-07-14 ENCOUNTER — Other Ambulatory Visit (HOSPITAL_COMMUNITY): Payer: Self-pay

## 2023-07-15 ENCOUNTER — Other Ambulatory Visit (HOSPITAL_COMMUNITY): Payer: Self-pay

## 2023-07-16 ENCOUNTER — Telehealth: Payer: Self-pay | Admitting: Pharmacy Technician

## 2023-07-16 ENCOUNTER — Other Ambulatory Visit (HOSPITAL_COMMUNITY): Payer: Self-pay

## 2023-07-16 NOTE — Telephone Encounter (Signed)
 Pharmacy Patient Advocate Encounter   Received notification from CoverMyMeds that prior authorization for Zepbound  2.5MG /0.5ML pen-injectors is required/requested.   Insurance verification completed.   The patient is insured through [employer] .   Per test claim: PA required and submitted KEY/EOC/Request #: VIA FAXDENIED.  Full denial letter will be uploaded to the media tab. See denial reason below.  Pt must try and fail Qsymia.  The full denial letter has already been uploaded to her media tab

## 2023-07-16 NOTE — Telephone Encounter (Signed)
 Since it says she must try qsymia- is that something you are willing to give if she wants to try?

## 2023-07-16 NOTE — Telephone Encounter (Signed)
 Contacted patient, declines qysmia. States she will try to contact her insurance company for an appeal.

## 2023-08-31 ENCOUNTER — Encounter: Payer: Self-pay | Admitting: Nurse Practitioner

## 2023-10-12 ENCOUNTER — Ambulatory Visit: Admitting: Nurse Practitioner
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
